# Patient Record
Sex: Male | Born: 1960 | Race: White | Hispanic: No | Marital: Single | Smoking: Current every day smoker
Health system: Southern US, Community
[De-identification: ages and names within clinical notes are randomized; demographics above are authoritative.]

## PROBLEM LIST (undated history)

## (undated) DIAGNOSIS — Z720 Tobacco use: Secondary | ICD-10-CM

## (undated) DIAGNOSIS — I10 Essential (primary) hypertension: Secondary | ICD-10-CM

## (undated) DIAGNOSIS — E7800 Pure hypercholesterolemia, unspecified: Secondary | ICD-10-CM

## (undated) DIAGNOSIS — E119 Type 2 diabetes mellitus without complications: Secondary | ICD-10-CM

## (undated) HISTORY — PX: ABDOMINAL SURGERY: SHX537

## (undated) HISTORY — PX: TONSILLECTOMY: SUR1361

---

## 2003-10-28 ENCOUNTER — Emergency Department (HOSPITAL_COMMUNITY): Admission: EM | Admit: 2003-10-28 | Discharge: 2003-10-28 | Payer: Self-pay | Admitting: Emergency Medicine

## 2015-07-31 ENCOUNTER — Encounter (HOSPITAL_COMMUNITY): Payer: Self-pay | Admitting: *Deleted

## 2015-07-31 ENCOUNTER — Observation Stay (HOSPITAL_COMMUNITY)
Admission: EM | Admit: 2015-07-31 | Discharge: 2015-08-01 | Disposition: A | Discharge status: Home / Self Care | Payer: Non-veteran care | Attending: Internal Medicine | Admitting: Internal Medicine

## 2015-07-31 ENCOUNTER — Emergency Department (HOSPITAL_COMMUNITY): Payer: Non-veteran care

## 2015-07-31 DIAGNOSIS — E119 Type 2 diabetes mellitus without complications: Secondary | ICD-10-CM

## 2015-07-31 DIAGNOSIS — I10 Essential (primary) hypertension: Secondary | ICD-10-CM | POA: Insufficient documentation

## 2015-07-31 DIAGNOSIS — E7800 Pure hypercholesterolemia, unspecified: Secondary | ICD-10-CM | POA: Diagnosis not present

## 2015-07-31 DIAGNOSIS — F17210 Nicotine dependence, cigarettes, uncomplicated: Secondary | ICD-10-CM | POA: Diagnosis not present

## 2015-07-31 DIAGNOSIS — Z7982 Long term (current) use of aspirin: Secondary | ICD-10-CM | POA: Diagnosis not present

## 2015-07-31 DIAGNOSIS — Z8249 Family history of ischemic heart disease and other diseases of the circulatory system: Secondary | ICD-10-CM | POA: Diagnosis not present

## 2015-07-31 DIAGNOSIS — G4733 Obstructive sleep apnea (adult) (pediatric): Secondary | ICD-10-CM | POA: Diagnosis not present

## 2015-07-31 DIAGNOSIS — F1010 Alcohol abuse, uncomplicated: Secondary | ICD-10-CM | POA: Insufficient documentation

## 2015-07-31 DIAGNOSIS — R079 Chest pain, unspecified: Secondary | ICD-10-CM | POA: Diagnosis not present

## 2015-07-31 DIAGNOSIS — F1020 Alcohol dependence, uncomplicated: Secondary | ICD-10-CM

## 2015-07-31 DIAGNOSIS — R072 Precordial pain: Secondary | ICD-10-CM | POA: Diagnosis not present

## 2015-07-31 DIAGNOSIS — E785 Hyperlipidemia, unspecified: Secondary | ICD-10-CM | POA: Diagnosis not present

## 2015-07-31 DIAGNOSIS — F419 Anxiety disorder, unspecified: Secondary | ICD-10-CM | POA: Diagnosis not present

## 2015-07-31 HISTORY — DX: Essential (primary) hypertension: I10

## 2015-07-31 HISTORY — DX: Pure hypercholesterolemia, unspecified: E78.00

## 2015-07-31 HISTORY — DX: Tobacco use: Z72.0

## 2015-07-31 HISTORY — DX: Type 2 diabetes mellitus without complications: E11.9

## 2015-07-31 LAB — CBC
HCT: 52.1 % — ABNORMAL HIGH (ref 39.0–52.0)
Hemoglobin: 17.4 g/dL — ABNORMAL HIGH (ref 13.0–17.0)
MCH: 30.9 pg (ref 26.0–34.0)
MCHC: 33.4 g/dL (ref 30.0–36.0)
MCV: 92.4 fL (ref 78.0–100.0)
Platelets: 194 10*3/uL (ref 150–400)
RBC: 5.64 MIL/uL (ref 4.22–5.81)
RDW: 13.4 % (ref 11.5–15.5)
WBC: 7.8 10*3/uL (ref 4.0–10.5)

## 2015-07-31 LAB — TROPONIN I
Troponin I: <0.03 ng/mL (ref <0.031)
Troponin I: <0.03 ng/mL (ref <0.031)

## 2015-07-31 LAB — BASIC METABOLIC PANEL
Anion gap: 12 (ref 5–15)
BUN: 9 mg/dL (ref 6–20)
CALCIUM: 9.4 mg/dL (ref 8.9–10.3)
CO2: 27 mmol/L (ref 22–32)
CREATININE: 0.99 mg/dL (ref 0.61–1.24)
Chloride: 101 mmol/L (ref 101–111)
GFR calc Af Amer: >60 mL/min (ref >60)
GLUCOSE: 129 mg/dL — AB (ref 65–99)
Potassium: 3.7 mmol/L (ref 3.5–5.1)
Sodium: 140 mmol/L (ref 135–145)

## 2015-07-31 LAB — TSH: TSH: 1.851 u[IU]/mL (ref 0.350–4.500)

## 2015-07-31 LAB — I-STAT TROPONIN, ED: TROPONIN I, POC: 0 ng/mL (ref 0.00–0.08)

## 2015-07-31 LAB — LIPID PANEL
CHOL/HDL RATIO: 4.7 ratio
Cholesterol: 200 mg/dL (ref 0–200)
HDL: 43 mg/dL (ref >40)
LDL Cholesterol: 121 mg/dL — ABNORMAL HIGH (ref 0–99)
Triglycerides: 182 mg/dL — ABNORMAL HIGH (ref <150)
VLDL: 36 mg/dL (ref 0–40)

## 2015-07-31 MED ORDER — MORPHINE SULFATE (PF) 2 MG/ML IV SOLN
2.0000 mg | Freq: Once | INTRAVENOUS | Status: AC
  Administered 2015-07-31: 2 mg via INTRAVENOUS
  Filled 2015-07-31: qty 1

## 2015-07-31 MED ORDER — ASPIRIN 81 MG PO CHEW
81.0000 mg | CHEWABLE_TABLET | Freq: Every day | ORAL | Status: DC
  Administered 2015-08-01: 81 mg via ORAL
  Filled 2015-07-31: qty 1

## 2015-07-31 MED ORDER — LORAZEPAM 1 MG PO TABS
1.0000 mg | ORAL_TABLET | Freq: Four times a day (QID) | ORAL | Status: DC | PRN
  Filled 2015-07-31: qty 1

## 2015-07-31 MED ORDER — GI COCKTAIL ~~LOC~~
30.0000 mL | Freq: Once | ORAL | Status: AC
  Administered 2015-07-31: 30 mL via ORAL
  Filled 2015-07-31: qty 30

## 2015-07-31 MED ORDER — LORAZEPAM 2 MG/ML IJ SOLN: 1.0000 mg | Freq: Four times a day (QID) | INTRAMUSCULAR | Status: DC | PRN

## 2015-07-31 MED ORDER — ADULT MULTIVITAMIN W/MINERALS CH
1.0000 | ORAL_TABLET | Freq: Every day | ORAL | Status: DC
  Filled 2015-07-31: qty 1

## 2015-07-31 MED ORDER — NITROGLYCERIN 0.4 MG SL SUBL
0.4000 mg | SUBLINGUAL_TABLET | SUBLINGUAL | Status: AC | PRN
  Administered 2015-07-31 (×3): 0.4 mg via SUBLINGUAL
  Filled 2015-07-31: qty 1

## 2015-07-31 MED ORDER — PANTOPRAZOLE SODIUM 40 MG PO TBEC
40.0000 mg | DELAYED_RELEASE_TABLET | Freq: Every day | ORAL | Status: DC
  Administered 2015-07-31 – 2015-08-01 (×2): 40 mg via ORAL
  Filled 2015-07-31 (×2): qty 1

## 2015-07-31 MED ORDER — HYDROCHLOROTHIAZIDE 25 MG PO TABS: 25.0000 mg | ORAL_TABLET | Freq: Every day | ORAL | Status: DC

## 2015-07-31 MED ORDER — HYDROCHLOROTHIAZIDE 25 MG PO TABS
25.0000 mg | ORAL_TABLET | Freq: Every day | ORAL | Status: DC
  Administered 2015-07-31 – 2015-08-01 (×2): 25 mg via ORAL
  Filled 2015-07-31 (×2): qty 1

## 2015-07-31 MED ORDER — LISINOPRIL 20 MG PO TABS: 20.0000 mg | ORAL_TABLET | Freq: Every day | ORAL | Status: DC

## 2015-07-31 MED ORDER — VITAMIN B-1 100 MG PO TABS
100.0000 mg | ORAL_TABLET | Freq: Every day | ORAL | Status: DC
  Administered 2015-07-31: 100 mg via ORAL
  Filled 2015-07-31 (×2): qty 1

## 2015-07-31 MED ORDER — FOLIC ACID 1 MG PO TABS
1.0000 mg | ORAL_TABLET | Freq: Every day | ORAL | Status: DC
  Administered 2015-08-01: 1 mg via ORAL
  Filled 2015-07-31: qty 1

## 2015-07-31 MED ORDER — THIAMINE HCL 100 MG/ML IJ SOLN
100.0000 mg | Freq: Every day | INTRAMUSCULAR | Status: DC
  Administered 2015-08-01: 100 mg via INTRAVENOUS
  Filled 2015-07-31: qty 2

## 2015-07-31 MED ORDER — HEPARIN SODIUM (PORCINE) 5000 UNIT/ML IJ SOLN
5000.0000 [IU] | Freq: Three times a day (TID) | INTRAMUSCULAR | Status: DC
  Administered 2015-07-31 – 2015-08-01 (×3): 5000 [IU] via SUBCUTANEOUS
  Filled 2015-07-31 (×3): qty 1

## 2015-07-31 MED ORDER — ADULT MULTIVITAMIN W/MINERALS CH
1.0000 | ORAL_TABLET | Freq: Every day | ORAL | Status: DC
  Administered 2015-08-01: 1 via ORAL
  Filled 2015-07-31: qty 1

## 2015-07-31 NOTE — ED Provider Notes (Addendum)
CSN: [id number]     Arrival date & time 07/31/15  0957 History   First MD Initiated Contact with Patient 07/31/15 1115     Chief Complaint  Patient presents with  . Chest Pain     (Consider location/radiation/quality/duration/timing/severity/associated sxs/prior Treatment) HPI Dominic Francis is a 55 year old man history of diabetes, hypertension, high cholesterol, smoker who presents today with substernal chest pain that began last night around 9:00. He states it lasted approximately 3 hours and then resolved on its own and he was able to sleep. He woke up this morning pain-free but pain began about hour after he awoke. There is again substernal in nature. He denies radiation, nausea, vomiting, dyspnea, or diaphoresis. He states he has had 2 similar episodes in the past where he was evaluated. One was in 2009 and the other was in 2012. He describes having a Cardiolite study and stress tests that he reports were normal. He has not had an evaluation since 2012. He has not been compliant with his medications. He was previously treated at the [facility]. He took his blood pressure medicine last night and this morning. He took 2 regular aspirin prior to coming into the ED. Pain is currently 4 out of 12 which is what it was at the maximum. Past Medical History  Diagnosis Date  . Diabetes mellitus without complication ([place])   . Hypertension   . High cholesterol    History reviewed. No pertinent past surgical history. History reviewed. No pertinent family history. Social History  Substance Use Topics  . Smoking status: Current Every Day Smoker    Types: Cigarettes  . Smokeless tobacco: None  . Alcohol Use: Yes    Review of Systems  All other systems reviewed and are negative.     Allergies  Review of patient's allergies indicates no known allergies.  Home Medications   Prior to Admission medications   Medication Sig Start Date End Date Taking? Authorizing Provider  glipiZIDE (GLUCOTROL  XL) 5 MG 24 hr tablet Take 5 mg by mouth daily with breakfast.   Yes Historical Provider, MD  hydrochlorothiazide (HYDRODIURIL) 25 MG tablet Take 25 mg by mouth daily.   Yes Historical Provider, MD  lisinopril (PRINIVIL,ZESTRIL) 40 MG tablet Take 20 mg by mouth daily.   Yes Historical Provider, MD  Multiple Vitamins-Minerals (MULTIVITAMIN WITH MINERALS) tablet Take 1 tablet by mouth daily.   Yes Historical Provider, MD   BP 173/107 mmHg  Pulse 78  Temp(Src) 97.7 F (36.5 C) (Oral)  Resp 12  SpO2 100% Physical Exam  Constitutional: He is oriented to person, place, and time. He appears well-developed and well-nourished.  HENT:  Head: Normocephalic and atraumatic.  Right Ear: External ear normal.  Left Ear: External ear normal.  Nose: Nose normal.  Mouth/Throat: Oropharynx is clear and moist.  Eyes: Conjunctivae and EOM are normal. Pupils are equal, round, and reactive to light.  Neck: Normal range of motion. Neck supple.  Cardiovascular: Normal rate, regular rhythm, normal heart sounds and intact distal pulses.   Pulmonary/Chest: Effort normal and breath sounds normal. No respiratory distress. He has no wheezes. He exhibits no tenderness.  Abdominal: Soft. Bowel sounds are normal. He exhibits no distension and no mass. There is no tenderness. There is no guarding.  Musculoskeletal: Normal range of motion.  Neurological: He is alert and oriented to person, place, and time. He has normal reflexes. He exhibits normal muscle tone. Coordination normal.  Skin: Skin is warm and dry.  Psychiatric: He has  a normal mood and affect. His behavior is normal. Judgment and thought content normal.  Nursing note and vitals reviewed.   ED Course  Procedures (including critical care time) Labs Review Labs Reviewed  BASIC METABOLIC PANEL - Abnormal; Notable for the following:    Glucose, Bld 129 (*)    All other components within normal limits  CBC - Abnormal; Notable for the following:     Hemoglobin 17.4 (*)    HCT 52.1 (*)    All other components within normal limits  I-STAT TROPOININ, ED    Imaging Review Dg Chest 2 View  07/31/2015  CLINICAL DATA:  8 hour history of central chest pain. EXAM: CHEST  2 VIEW COMPARISON:  10/28/2003 FINDINGS: The cardiac silhouette, mediastinal and hilar contours are within normal limits. There is mild tortuosity and calcification of the thoracic aorta. The lungs are clear. No pleural effusion. Remote healed bilateral rib fractures are noted. No acute bony findings. IMPRESSION: No acute cardiopulmonary findings. Electronically Signed   By: Marijo Sanes M.D.   On: 07/31/2015 10:51   I have personally reviewed and evaluated these images and lab results as part of my medical decision-making.   EKG Interpretation   Date/Time:  Monday July 31 2015 10:03:08 EST Ventricular Rate:  77 PR Interval:  144 QRS Duration: 88 QT Interval:  384 QTC Calculation: 434 R Axis:   93 Text Interpretation:  Normal sinus rhythm Rightward axis Nonspecific T  wave abnormality Abnormal ECG Poor data quality Confirmed by Aubryn Spinola MD,  Andee Poles [phone]) on 07/31/2015 11:16:39 AM      MDM   Final diagnoses:  Chest pain, unspecified chest pain type   Dominic Francis is a 55 year old man with diabetes, hypertension, hypercholesterolemia who is a smoker and presents today complaining of chest pain. EKG is significant for nonspecific T-wave abnormalities. Pain is decreased here after sublingual nitroglycerin, aspirin, GI cocktail, and pain medicine. Cardiology is consulted for further evaluation.     Pattricia Boss, MD 07/31/15 1429  Cardiology saw patient and place note. They request medicine to admit. I spoke with Dr. Hulen Luster on for internal medicine service and they will see and evaluate  Pattricia Boss, MD 07/31/15 1601

## 2015-07-31 NOTE — H&P (Signed)
Date: 07/31/2015               Patient Name:  Dominic Francis MRN: [id number]  DOB: [birth year] Age / Sex: 55 y.o., male   PCP: No primary care provider on file.         Medical Service: Internal Medicine Teaching Service         Attending Physician: Dr. Aldine Contes, MD    First Contact: Dr. Liberty Handy Pager: [phone]  Second Contact: Dr. Julious Oka Pager: [phone]       After Hours (After 5p/  First Contact Pager: [phone]  weekends / holidays): Second Contact Pager: [phone]   Chief Complaint: Chest pain  History of Present Illness:   Ms. Ess is a 55 year old man with a PMH of T2DM, HTN, HLD, OSA, and tobacco abuse who presents with chest pain, which started last night around 9 pm. He describes the pain has at his sternum and non-radiating, saying it felt as though someone were pressing on his chest really hard. He said that it was intermittent through the night, lasting up to 3 hours at a time, and he could not identify and aggravating or alleviating factors. He's reported two similar episodes in the past, 2009 and 2012 and reports being evaluated. Now, he is chest pain free. It was associated with a "little nausea," but he denies any diaphoresis. He endorses a longstanding history of indigestion and gas after meals. He denies any headaches, new one-sided weakness, numbness or tingling, daytime sleepiness, light-headedness, sore throat, shortness of breath, orthopnea, PND, heart palpitations, abdominal pain, nausea, vomiting, diarrhea, leg swelling. With respect to his OSA history, he only endorses daytime irritability. He said he's had a sleep study before, and he has a CPAP, but he does not use it. He said he would not use it in the hospital. He's been prescribed glipizide, lisinopril, HCTZ at home, but he does not take any of them. He says he checks his blood sugars at home, and they've been "fine." Family history significant for uncle having an MI at 96 years old. He reports a 30  pack-year smoking history, drinks a 6-pack of beer a day regularly, and denies any illicit drug use. He does not need an "eye opener," but says he occasionally gets "the shakes" in his hands. He denies any history of seizures. He works the night shift working on [employer]. He lives with someone from whom he's rented a room for 3 years.   In the ED, EKG was significant for t-wave flattening but no ischemic ST changes. His chest pain decreased with sublingual NTG, aspirin, GI cocktail, and IV morphine. i-Stat troponin negative. He was evaluated by cardiology, who recommended trending his troponins overnight, and if negative and stress test in the morning.  Meds: Current Facility-Administered Medications  Medication Dose Route Frequency Provider Last Rate Last Dose  . [START ON 08/01/2015] aspirin chewable tablet 81 mg  81 mg Oral Daily Norman Herrlich, MD      . folic acid (FOLVITE) tablet 1 mg  1 mg Oral Daily Norman Herrlich, MD      . heparin injection 5,000 Units  5,000 Units Subcutaneous 3 times per day Norman Herrlich, MD      . hydrochlorothiazide (HYDRODIURIL) tablet 25 mg  25 mg Oral Daily Norman Herrlich, MD      . LORazepam (ATIVAN) tablet 1 mg  1 mg Oral Q6H PRN Norman Herrlich, MD  Or  . LORazepam (ATIVAN) injection 1 mg  1 mg Intravenous Q6H PRN Norman Herrlich, MD      . multivitamin with minerals tablet 1 tablet  1 tablet Oral Daily Norman Herrlich, MD      . multivitamin with minerals tablet 1 tablet  1 tablet Oral Daily Norman Herrlich, MD      . pantoprazole (PROTONIX) EC tablet 40 mg  40 mg Oral Daily Norman Herrlich, MD      . thiamine (VITAMIN B-1) tablet 100 mg  100 mg Oral Daily Norman Herrlich, MD       Or  . thiamine (B-1) injection 100 mg  100 mg Intravenous Daily Norman Herrlich, MD        Allergies: Allergies as of 07/31/2015  . (No Known Allergies)   Past Medical History  Diagnosis Date  . Diabetes mellitus without complication ([place])   .  Hypertension   . High cholesterol   . Tobacco use    Past Surgical History  Procedure Laterality Date  . Abdominal surgery  approx 1975    part of intestines removed 2nd infection  . Tonsillectomy     Family History  Problem Relation Age of Onset  . Dementia Mother   . Diabetes Father    Social History   Social History  . Marital Status: Single    Spouse Name: N/A  . Number of Children: N/A  . Years of Education: N/A   Occupational History  . Engineer, technical sales, Metallurgist    Social History Main Topics  . Smoking status: Current Every Day Smoker -- 1.00 packs/day for 38 years    Types: Cigarettes  . Smokeless tobacco: Never Used  . Alcohol Use: 25.2 oz/week    42 Standard drinks or equivalent per week     Comment: 6 pack/day  . Drug Use: No  . Sexual Activity: Not on file   Other Topics Concern  . Not on file   Social History Narrative   Pt rents a room from a friend.    Review of Systems: Negative except per HPI  Physical Exam: Blood pressure 140/92, pulse 79, temperature 97.7 F (36.5 C), temperature source Oral, resp. rate 18, SpO2 97 %. General: Lying in bed, NAD. Well developed, well nourished HEENT: No scleral icterus. EOMI. Poor dentition, moist mucous membranes.  Cardiovascular/Chest: RRR, no murmurs or rubs. Normal S1,S2. No S3, S4. Chest pain non reproducible with palpation. Pulmonary: Clear to auscultation bilaterally. No respiratory distress. Abdominal: Soft, NT/ND. Normal bowel sounds Extremities: No clibbing, cyanosis, or edema Skin: No rashes noted. Warm and dry. Neurological: AAOx3. No focal deficits Psychiatric: Behavior and affect normal.  Lab results: Basic Metabolic Panel:  Recent Labs  07/31/15 1012  NA 140  K 3.7  CL 101  CO2 27  GLUCOSE 129*  BUN 9  CREATININE 0.99  CALCIUM 9.4   CBC:  Recent Labs  07/31/15 1012  WBC 7.8  HGB 17.4*  HCT 52.1*  MCV 92.4  PLT 194    Imaging results:  Dg Chest 2  View  07/31/2015  CLINICAL DATA:  8 hour history of central chest pain. EXAM: CHEST  2 VIEW COMPARISON:  10/28/2003 FINDINGS: The cardiac silhouette, mediastinal and hilar contours are within normal limits. There is mild tortuosity and calcification of the thoracic aorta. The lungs are clear. No pleural effusion. Remote healed bilateral rib fractures are noted. No acute bony findings. IMPRESSION: No acute cardiopulmonary findings. Electronically  Signed   By: Marijo Sanes M.D.   On: 07/31/2015 10:51    EKG: T-Wave Flattening  Assessment & Plan by Problem: Principal Problem:   Precordial chest pain Active Problems:   Diabetes mellitus without complication (H[facility])   Hypertension   High cholesterol   Chest pain  Chest Pain: Appears non-cardiac in origin and could be GERD or precordial catch syndrome. We will trend troponins, and if negative, he will have a stress test in the morning.  - ASA 81 mg daily - NTG 0.4 mg q5 minutes prn - Protonix 40 mg daily - Trend troponins - Stress test in AM  Tobacco Abuse:  Patient says he would not like a patch at the moment. Likely leading to Hgb of 17.4. - Cessation counseling provided - Nicotine patch 14 mg if requested - CBC in AM  Alcohol Abuse: Patient does report some withdrawal symptoms in the past, and is a regular drinker - CIWA protocol - Thiamine, folate po  HTN: 140/92 on admission. Previously prescribed HCTZ and lisinopril - Restart only HCTZ 25 mg daily for now  [id number] without complication: BG [id number] on admission. - Holding glipizide  DVT prophylaxis: Heparin [place]   Dispo: Disposition is deferred at this time, awaiting improvement of current medical problems. Anticipated discharge in approximately 1-2 day(s).   The patient does not have a current PCP (No primary care provider on file.) and does need an [facility] hospital follow-up appointment after discharge.  The patient does not have transportation limitations that hinder transportation to  clinic appointments.  Signed: Liberty Handy, MD 07/31/2015, 5:50 PM

## 2015-07-31 NOTE — Consult Note (Signed)
CARDIOLOGY CONSULT NOTE   Patient ID: Dominic Francis MRN: [id number] DOB/AGE: Dec 21, 1960 55 y.o.  Admit date: 07/31/2015  Primary Physician   Was the [place], none in 2-3 years Primary Cardiologist   None Reason for Consultation  Chest pain  Dominic Francis is a 55 y.o. year old male with a history of HTN, HL, DM, tob use, ETOH use.   He has no recent medical care, generally follows at the [facility] in [place], but has not been there since they opened the new facility (?2014).  Pt with hx chest pain, evaluation always negative. He had a stress test at [facility] in 2009 and it was OK.   Pt has been having chest pain regularly. It would generally be at work (3 pm-1:30 am) He would drink water and the pain would improve. The symptoms have been coming on more frequently recently.  He had symptoms last pm at home, and they were more severe than usual. They kept him from sleeping. He took a BP med (probably HCTZ) with no change. No other meds tried. He finally fell asleep, and was pain-free when he woke up. There were no associated symptoms with the pain. Pressure and tightness, 4/10.   This am, initially had no pain. About 1 hour after drinking coffee, he had recurrent pain. He took his prescribed medications plus 2 regular aspirin. It started about 7:30 am. He came to the hospital when symptoms did not resolve. A GI cocktail helped the pain and morphine also helped. Pt got SL NTG x 3, but did not mention them as helping. Still with 1/10 pain. It may have been a 0/10 just after morphine, but is now present at a low level.     Past Medical History  Diagnosis Date  . Diabetes mellitus without complication ([place])   . Hypertension   . High cholesterol   . Tobacco use      Past Surgical History  Procedure Laterality Date  . Abdominal surgery  approx 1975    part of intestines removed 2nd infection  . Tonsillectomy      No Known Allergies  I have reviewed the patient's current medications Prior  to Admission medications - NONE  Medication Sig Start Date End Date Taking? Authorizing Provider  glipiZIDE (GLUCOTROL XL) 5 MG 24 hr tablet Take 5 mg by mouth daily with breakfast.   No Historical Provider, MD  hydrochlorothiazide (HYDRODIURIL) 25 MG tablet Take 25 mg by mouth daily.   No Historical Provider, MD  lisinopril (PRINIVIL,ZESTRIL) 40 MG tablet Take 20 mg by mouth daily.   No Historical Provider, MD  Multiple Vitamins-Minerals (MULTIVITAMIN WITH MINERALS) tablet Take 1 tablet by mouth daily.   Yes Historical Provider, MD     Social History   Social History  . Marital Status: Single    Spouse Name: N/A  . Number of Children: N/A  . Years of Education: N/A   Occupational History  . Engineer, technical sales, Metallurgist    Social History Main Topics  . Smoking status: Current Every Day Smoker -- 1.00 packs/day for 38 years    Types: Cigarettes  . Smokeless tobacco: Never Used  . Alcohol Use: 25.2 oz/week    42 Standard drinks or equivalent per week     Comment: 6 pack/day  . Drug Use: No  . Sexual Activity: Not on file   Other Topics Concern  . Not on file   Social History Narrative   Pt rents a room  from a friend.    Family Status  Relation Status Death Age  . Mother Deceased 73    Hx SEM  . Father Deceased 39    MVA   Family History  Problem Relation Age of Onset  . Dementia Mother   . Diabetes Father      ROS:  Full 14 point review of systems complete and found to be negative unless listed above.  Physical Exam: Blood pressure 143/92, pulse 67, temperature 97.7 F (36.5 C), temperature source Oral, resp. rate 14, SpO2 97 %.  General: Well developed, well nourished, male in no acute distress Head: Eyes PERRLA, No xanthomas.   Normocephalic and atraumatic, oropharynx without edema or exudate. Dentition: poor Lungs: Rales bases  Heart: HRRR S1 S2, no rub/gallop, no murmur. pulses are 2+ extrem.   Neck: No carotid bruits. No lymphadenopathy.  JVD not  elevated. Abdomen: Bowel sounds present, abdomen soft and non-tender without masses or hernias noted. Msk:  No spine or cva tenderness. No weakness, no joint deformities or effusions. Extremities: No clubbing or cyanosis. No edema.  Neuro: Alert and oriented X 3. No focal deficits noted. Psych:  Good affect, responds appropriately Skin: No rashes or lesions noted.  Labs:   Lab Results  Component Value Date   WBC 7.8 07/31/2015   HGB 17.4* 07/31/2015   HCT 52.1* 07/31/2015   MCV 92.4 07/31/2015   PLT 194 07/31/2015     Recent Labs Lab 07/31/15 1012  NA 140  K 3.7  CL 101  CO2 27  BUN 9  CREATININE 0.99  CALCIUM 9.4  GLUCOSE 129*    Recent Labs  07/31/15 1026  TROPIPOC 0.00   ECG: 07/31/2015 SR, diffuse T wave flattening Vent. rate 77 BPM PR interval 144 ms QRS duration 88 ms QT/QTc 384/434 ms P-R-T axes 17 93 10  Radiology:  Dg Chest 2 View 07/31/2015  CLINICAL DATA:  8 hour history of central chest pain. EXAM: CHEST  2 VIEW COMPARISON:  10/28/2003 FINDINGS: The cardiac silhouette, mediastinal and hilar contours are within normal limits. There is mild tortuosity and calcification of the thoracic aorta. The lungs are clear. No pleural effusion. Remote healed bilateral rib fractures are noted. No acute bony findings. IMPRESSION: No acute cardiopulmonary findings. Electronically Signed   By: Marijo Sanes M.D.   On: 07/31/2015 10:51    ASSESSMENT AND PLAN:   The patient was seen today by Dr Mare Ferrari, the patient evaluated and the data reviewed.  Principal Problem:   Precordial chest pain - symptoms are atypical, partly relieved by GI cocktail, initial ez and ECG negative - however, he has multiple CRFs that are poorly controlled. - agree with admit to R/O MI - if ez negative (likely), would do inpatient stress test in am  Otherwise, feel he would benefit from IM admit to get better management of his chronic medical conditions. May need CIWA protocol Active  Problems:   Diabetes mellitus without complication ([place])   Hypertension   High cholesterol   Tobacco use    ETOH use   Signed: Lenoard Aden 07/31/2015 3:08 PM Beeper [id number]  Co-Sign MD The patient was seen in the emergency room.  Currently he is essentially pain-free.  He is presenting chest pain appeared to respond to GI cocktail and morphine.  He had also taken aspirin prior to coming to the emergency room.  As noted above, he has multiple risk factors for coronary disease and has not been on any medication to  address these recently.  He has been working full time on the second shift Engineer, building services on aircraft.  He works four 10 hour days from 3 PM until 1 AM and he has 4 days off each week.  He smokes about one pack of cigarettes daily.  He does admit to a smoker's cough.  Physical examination today reveals clear lung fields.  The heart reveals no murmur gallop rub or click.  The abdomen is nontender.  The liver is not enlarged.  The extremities show good pedal pulses and no phlebitis or edema.  His electrocardiogram was personally reviewed and shows nonspecific T-wave flattening but no ischemic ST segment depression.  Chest x-ray is unremarkable except for old healed rib fractures. I agree with plan as noted above.  If cardiac enzymes remain negative overnight, anticipate stress test in a.m. and possible discharge later in the day EF stress test normal.  He will require good teaching about treatment of his various chronic conditions to reduce risk factors going forward.  Smoking cessation discussed.

## 2015-07-31 NOTE — ED Notes (Signed)
Pt reports mid chest pressure that started last night. Recent cough. Denies sob. Airway intact and ekg done at triage.

## 2015-07-31 NOTE — ED Notes (Signed)
Pt refuses IV at this time 

## 2015-08-01 ENCOUNTER — Other Ambulatory Visit (HOSPITAL_COMMUNITY): Payer: Non-veteran care

## 2015-08-01 ENCOUNTER — Observation Stay (HOSPITAL_COMMUNITY): Payer: Non-veteran care

## 2015-08-01 DIAGNOSIS — E785 Hyperlipidemia, unspecified: Secondary | ICD-10-CM

## 2015-08-01 DIAGNOSIS — F17210 Nicotine dependence, cigarettes, uncomplicated: Secondary | ICD-10-CM | POA: Diagnosis not present

## 2015-08-01 DIAGNOSIS — R072 Precordial pain: Secondary | ICD-10-CM | POA: Diagnosis not present

## 2015-08-01 DIAGNOSIS — I10 Essential (primary) hypertension: Secondary | ICD-10-CM | POA: Insufficient documentation

## 2015-08-01 DIAGNOSIS — R079 Chest pain, unspecified: Secondary | ICD-10-CM | POA: Diagnosis not present

## 2015-08-01 DIAGNOSIS — F1020 Alcohol dependence, uncomplicated: Secondary | ICD-10-CM | POA: Diagnosis not present

## 2015-08-01 LAB — CBC
HCT: 49.1 % (ref 39.0–52.0)
Hemoglobin: 16.7 g/dL (ref 13.0–17.0)
MCH: 31.3 pg (ref 26.0–34.0)
MCHC: 34 g/dL (ref 30.0–36.0)
MCV: 92.1 fL (ref 78.0–100.0)
PLATELETS: 174 10*3/uL (ref 150–400)
RBC: 5.33 MIL/uL (ref 4.22–5.81)
RDW: 13.5 % (ref 11.5–15.5)
WBC: 7.9 10*3/uL (ref 4.0–10.5)

## 2015-08-01 LAB — NM MYOCAR MULTI W/SPECT W/WALL MOTION / EF
CHL CUP RESTING HR STRESS: 70 {beats}/min
CSEPED: 5 min
CSEPEW: 1 METS
MPHR: 165 {beats}/min
Peak HR: 110 {beats}/min
Percent HR: 66 %

## 2015-08-01 LAB — HEMOGLOBIN A1C
HEMOGLOBIN A1C: 6.4 % — AB (ref 4.8–5.6)
Mean Plasma Glucose: 137 mg/dL

## 2015-08-01 LAB — TROPONIN I

## 2015-08-01 MED ORDER — REGADENOSON 0.4 MG/5ML IV SOLN
INTRAVENOUS | Status: AC
  Filled 2015-08-01: qty 5

## 2015-08-01 MED ORDER — ATORVASTATIN CALCIUM 40 MG PO TABS: 40.0000 mg | ORAL_TABLET | Freq: Every day | ORAL | Status: DC

## 2015-08-01 MED ORDER — REGADENOSON 0.4 MG/5ML IV SOLN
0.4000 mg | Freq: Once | INTRAVENOUS | Status: AC
  Administered 2015-08-01: 0.4 mg via INTRAVENOUS
  Filled 2015-08-01: qty 5

## 2015-08-01 MED ORDER — NICOTINE 21 MG/24HR TD PT24
21.0000 mg | MEDICATED_PATCH | Freq: Every day | TRANSDERMAL | Status: DC
  Administered 2015-08-01: 21 mg via TRANSDERMAL
  Filled 2015-08-01: qty 1

## 2015-08-01 MED ORDER — ASPIRIN 81 MG PO CHEW: 81.0000 mg | CHEWABLE_TABLET | Freq: Every day | ORAL | Status: DC

## 2015-08-01 MED ORDER — TECHNETIUM TC 99M SESTAMIBI GENERIC - CARDIOLITE
10.0000 | Freq: Once | INTRAVENOUS | Status: AC | PRN
  Administered 2015-08-01: 10 via INTRAVENOUS

## 2015-08-01 MED ORDER — TECHNETIUM TC 99M SESTAMIBI GENERIC - CARDIOLITE
30.0000 | Freq: Once | INTRAVENOUS | Status: AC | PRN
  Administered 2015-08-01: 30 via INTRAVENOUS

## 2015-08-01 MED ORDER — NICOTINE 21 MG/24HR TD PT24: 21.0000 mg | MEDICATED_PATCH | Freq: Every day | TRANSDERMAL | Status: DC

## 2015-08-01 MED ORDER — PANTOPRAZOLE SODIUM 40 MG PO TBEC: 40.0000 mg | DELAYED_RELEASE_TABLET | Freq: Every day | ORAL | Status: DC

## 2015-08-01 NOTE — Progress Notes (Signed)
Patient Name: Dominic Francis Date of Encounter: 08/01/2015  Principal Problem:   Precordial chest pain Active Problems:   Diabetes mellitus without complication ([place])   Hypertension   High cholesterol   Chest pain    Primary Cardiologist: New Patient Profile: 55 yo male w/ PMH of HTN, HLD, Type 2 DM, tobacco abuse, and ETOH abuse, admitted on 07/31/2015 for chest pain.  SUBJECTIVE: Denies any repeat episodes of chest pain overnight. Denies palpitations or shortness of breath. Wants to go home as soon as possible today. Was not listed as "NPO since midnight" but reports only having sips of water since midnight. In agreement to stay for NST this morning.  OBJECTIVE Filed Vitals:   07/31/15 1848 07/31/15 2358 08/01/15 0001 08/01/15 0651  BP:  135/88 135/88 115/83  Pulse:  60 64 69  Temp:  97.8 F (36.6 C)  97.6 F (36.4 C)  TempSrc:  Oral  Oral  Resp:  18  18  Height: 5\' 5"  (1.651 m)     Weight:      SpO2:  99%  99%    Intake/Output Summary (Last 24 hours) at 08/01/15 0716 Last data filed at 07/31/15 1753  Gross per 24 hour  Intake      0 ml  Output    300 ml  Net   -300 ml   Filed Weights   07/31/15 1734  Weight: 187 lb 8 oz (85.049 kg)    PHYSICAL EXAM General: Well developed, well nourished, male in no acute distress. Head: Normocephalic, atraumatic.  Neck: Supple without bruits, JVD not elevated. Lungs:  Resp regular and unlabored, CTA without wheezing or rales. Heart: RRR, S1, S2, no S3, S4, or murmur; no rub. Abdomen: Soft, non-tender, non-distended with normoactive bowel sounds. No hepatomegaly. No rebound/guarding. No obvious abdominal masses. Extremities: No clubbing, cyanosis, or edema. Distal pedal pulses are 2+ bilaterally. Neuro: Alert and oriented X 3. Moves all extremities spontaneously. Psych: Normal affect.  LABS: CBC: Recent Labs  07/31/15 1012 08/01/15 0429  WBC 7.8 7.9  HGB 17.4* 16.7  HCT 52.1* 49.1  MCV 92.4 92.1  PLT 194 [id number]    Basic Metabolic Panel: Recent Labs  07/31/15 1012  NA 140  K 3.7  CL 101  CO2 27  GLUCOSE 129*  BUN 9  CREATININE 0.99  CALCIUM 9.4   Cardiac Enzymes: Recent Labs  07/31/15 1645 07/31/15 2226 08/01/15 0429  TROPONINI <0.03 <0.03 <0.03    Recent Labs  07/31/15 1026  TROPIPOC 0.00   Hemoglobin A1C: Recent Labs  07/31/15 1645  HGBA1C 6.4*   Fasting Lipid Panel: Recent Labs  07/31/15 1645  CHOL 200  HDL 43  LDLCALC 121*  TRIG 182*  CHOLHDL 4.7   Thyroid Function Tests: Recent Labs  07/31/15 1645  TSH 1.851    TELE: NSR with rate in 60's - 70's. No atopic events.       Radiology/Studies: Dg Chest 2 View: 07/31/2015  CLINICAL DATA:  8 hour history of central chest pain. EXAM: CHEST  2 VIEW COMPARISON:  10/28/2003 FINDINGS: The cardiac silhouette, mediastinal and hilar contours are within normal limits. There is mild tortuosity and calcification of the thoracic aorta. The lungs are clear. No pleural effusion. Remote healed bilateral rib fractures are noted. No acute bony findings. IMPRESSION: No acute cardiopulmonary findings. Electronically Signed   By: Marijo Sanes M.D.   On: 07/31/2015 10:51     Current Medications:  . aspirin  81 mg Oral Daily  .  folic acid  1 mg Oral Daily  . heparin  5,000 Units Subcutaneous 3 times per day  . hydrochlorothiazide  25 mg Oral Daily  . multivitamin with minerals  1 tablet Oral Daily  . pantoprazole  40 mg Oral Daily  . thiamine  100 mg Oral Daily   Or  . thiamine  100 mg Intravenous Daily      ASSESSMENT AND PLAN:  1. Precordial chest pain - denies any repeat episodes of chest pain overnight.  - cyclic troponin values have been negative. - for Nuclear Stress Test this AM. If normal, will likely be stable for discharge from Cardiology perspective.  2. HTN - BP improved since admission - continue current medication regimen. Can likely restart PTA Lisinopril at 10mg  daily.  3. Diabetes mellitus without  complication - 123456 at 6.4. - per admitting team  4. Alcohol Use - currently on CIWA protocol.   Signed, Erma Heritage , PA-C 7:16 AM 08/01/2015 Pager: [phone] Patient known to me from admission consult yesterday. Enzymes normal overnight. Presently down in nuclear getting stress test. If stress test normal he can be discharged this afternoon. Needs to re-establish close followup with doctors at the [facility] hospital to manage risk factors.

## 2015-08-01 NOTE — Progress Notes (Signed)
Order received to discharge.  Telemetry removed and CCMD notified.  IV removed with catheter intact.  Discharge education given including new medications and smoking cessation education.  Pt indicates understanding.  Nicotine patch removed at discharge for Pt safety as Pt intends to smoke upon discharge.  Pt ambulated with this nurse to exit.  Pt denies chest pain or sob.  No s/s of distress.  Pt stable at discharge.

## 2015-08-01 NOTE — Discharge Summary (Signed)
Name: Dominic Francis MRN: [id number] DOB: 09/21/1960 55 y.o. PCP: No primary care provider on file.  Date of Admission: 07/31/2015 10:49 AM Date of Discharge: 08/01/2015 Attending Physician: No att. providers found  Discharge Diagnosis: 1. Chest pain 2. HTN 3. HLD 4. T2DM 5. Alcohol Use Disorder 6. Tobacco Use Disorder   Discharge Medications:   Medication List    STOP taking these medications        glipiZIDE 5 MG 24 hr tablet  Commonly known as:  GLUCOTROL XL      TAKE these medications        aspirin 81 MG chewable tablet  Chew 1 tablet (81 mg total) by mouth daily.     atorvastatin 40 MG tablet  Commonly known as:  LIPITOR  Take 1 tablet (40 mg total) by mouth daily.     hydrochlorothiazide 25 MG tablet  Commonly known as:  HYDRODIURIL  Take 25 mg by mouth daily.     lisinopril 40 MG tablet  Commonly known as:  PRINIVIL,ZESTRIL  Take 20 mg by mouth daily.     multivitamin with minerals tablet  Take 1 tablet by mouth daily.     nicotine 21 mg/24hr patch  Commonly known as:  NICODERM CQ - dosed in mg/24 hours  Place 1 patch (21 mg total) onto the skin daily.     pantoprazole 40 MG tablet  Commonly known as:  PROTONIX  Take 1 tablet (40 mg total) by mouth daily.        Disposition and follow-up:   Dominic Francis was discharged from [facility] in good condition.  At the hospital follow up visit please address:  1.  Assess compliance to medications at follow-up visit. Assess need for glipizide.   2.  Labs / imaging needed at time of follow-up: Outpatient TTE after he has been on blood pressure medications for several months.  3.  Pending labs/ test needing follow-up: None  Follow-up Appointments: Follow-up Information    Follow up with Ermalinda Barrios, PA-C On 08/21/2015.   Specialty:  Cardiology   Why:  Cardiology Hospital Follow-Up on 08/21/2015 at 12:45PM.   Contact information:   [address] [place]  [address] [phone]       Follow up with Sherin Quarry, MD On 08/10/2015.   Specialty:  Internal Medicine   Why:  at 10:15   Contact information:   [address] [place] [address] [phone]       Discharge Instructions: Discharge Instructions    Call MD for:  difficulty breathing, headache or visual disturbances    Complete by:  As directed      Call MD for:  persistant nausea and vomiting    Complete by:  As directed      Call MD for:  severe uncontrolled pain    Complete by:  As directed      Increase activity slowly    Complete by:  As directed           Start taking lipitor 40mg  daily for your high cholesterol.   I have also sent in prescriptions for nicotine patches to help you quit smoking.   Start taking protonix daily for acid reflux.   Our clinic office will call you a hospital follow up appointment in 1-2 weeks. If you do not hear from them please call [phone] for an appointment. Also, meanwhile please try and schedule an appointment with the [place].  Consultations: Treatment Team:  Rounding Lbcardiology, MD  Procedures Performed:  Dg Chest 2 View  07/31/2015  CLINICAL DATA:  8 hour history of central chest pain. EXAM: CHEST  2 VIEW COMPARISON:  10/28/2003 FINDINGS: The cardiac silhouette, mediastinal and hilar contours are within normal limits. There is mild tortuosity and calcification of the thoracic aorta. The lungs are clear. No pleural effusion. Remote healed bilateral rib fractures are noted. No acute bony findings. IMPRESSION: No acute cardiopulmonary findings. Electronically Signed   By: Marijo Sanes M.D.   On: 07/31/2015 10:51   Nm Myocar Multi W/spect W/wall Motion / Ef  08/01/2015  CLINICAL DATA:  Diabetes.  Hypertension.  Tobacco and alcohol abuse. EXAM: MYOCARDIAL IMAGING WITH SPECT (REST AND PHARMACOLOGIC-STRESS) GATED LEFT VENTRICULAR WALL MOTION STUDY LEFT VENTRICULAR EJECTION FRACTION TECHNIQUE: Standard myocardial SPECT imaging was  performed after resting intravenous injection of 10 mCi Tc-21m sestamibi. Subsequently, intravenous infusion of Lexiscan was performed under the supervision of the Cardiology staff. At peak effect of the drug, 30 mCi Tc-65m sestamibi was injected intravenously and standard myocardial SPECT imaging was performed. Quantitative gated imaging was also performed to evaluate left ventricular wall motion, and estimate left ventricular ejection fraction. COMPARISON:  Chest radiograph of 07/31/2015 FINDINGS: Perfusion: decreased rest uptake in the inferior wall, possibly related to diaphragmatic attenuation. Smaller area of mild reversibility is suspected within the anterior septal wall, apical segment. Wall Motion: No focal wall motion abnormality. Left Ventricular Ejection Fraction: 48 % End diastolic volume 80 ml End systolic volume 42 ml IMPRESSION: 1. Subtle reversibility suspected in the apical segment of the anteroseptal wall. 2. No focal wall motion abnormality. 3. Left ventricular ejection fraction 48% 4. Intermediate-risk stress test findings*. *2012 Appropriate Use Criteria for Coronary Revascularization Focused Update: J Am Coll Cardiol. [id number]. http://content.airportbarriers.com.aspx?articleid=[email or web address] Electronically Signed   By: Abigail Miyamoto M.D.   On: 08/01/2015 15:16   Admission HPI: Dominic Francis is a 55 year old man with a PMH of T2DM, HTN, HLD, OSA, and tobacco abuse who presents with chest pain, which started last night around 9 pm. He describes the pain has at his sternum and non-radiating, saying it felt as though someone were pressing on his chest really hard. He said that it was intermittent through the night, lasting up to 3 hours at a time, and he could not identify and aggravating or alleviating factors. He's reported two similar episodes in the past, 2009 and 2012 and reports being evaluated. Now, he is chest pain free. It was associated with a "little nausea," but he denies any  diaphoresis. He endorses a longstanding history of indigestion and gas after meals. He denies any headaches, new one-sided weakness, numbness or tingling, daytime sleepiness, light-headedness, sore throat, shortness of breath, orthopnea, PND, heart palpitations, abdominal pain, nausea, vomiting, diarrhea, leg swelling. With respect to his OSA history, he only endorses daytime irritability. He said he's had a sleep study before, and he has a CPAP, but he does not use it. He said he would not use it in the hospital. He's been prescribed glipizide, lisinopril, HCTZ at home, but he does not take any of them. He says he checks his blood sugars at home, and they've been "fine." Family history significant for uncle having an MI at 26 years old. He reports a 30 pack-year smoking history, drinks a 6-pack of beer a day regularly, and denies any illicit drug use. He does not need an "eye opener," but says he occasionally gets "the shakes" in his hands.  He denies any history of seizures. He works the night shift working on [employer]. He lives with someone from whom he's rented a room for 3 years.   In the ED, EKG was significant for t-wave flattening but no ischemic ST changes. His chest pain decreased with sublingual NTG, aspirin, GI cocktail, and IV morphine. i-Stat troponin negative. He was evaluated by cardiology, who recommended trending his troponins overnight, and if negative and stress test in the morning.  Hospital Course by problem list:   Chest pain: From admission, his chest pain appeared to be non-cardiac in origin, and his troponins were negative x3. A nuclear stress test was performed on day of discharge, which was reviewed by cardiologist Dr. Mare Ferrari, and there was no significant ischemia demonstrated, only inferior attenuation and apical thinning (SDS 2). He was set up with an outpatient cardiology visit on 1/30. He remained chest pain free for 24 hours.   HTN: Previously was  prescribed HCTZ and lisinopril, but he had not been taking them. .140/92 on admission, which improved to 135/84 on day of discharge with HCTZ 25 mg daily.  HLD: LDL 121. He was started on atorvastatin 40 mg daily  T2DM: He was prescribed glipizide, which he was not taken. This was held and his BGs were in the 100s. Hgb A1c was 6.4. Glipizide was held on discharge.   Alcohol Use Disorder: Patient was agitated on morning discharge which responded well to nicotine patch and ativan. He denied any withdrawal symptoms  Tobacco Use Disorder: Smoking cessation discussed. On 21 mg patch in the hospital.   Discharge Vitals:   BP 136/86 mmHg  Pulse 79  Temp(Src) 97.7 F (36.5 C) (Oral)  Resp 18  Ht 5\' 5"  (1.651 m)  Wt 187 lb 8 oz (85.049 kg)  BMI 31.20 kg/m2  SpO2 97%  Discharge Labs:  Results for orders placed or performed during the hospital encounter of 07/31/15 (from the past 24 hour(s))  Troponin I     Status: None   Collection Time: 07/31/15 10:26 PM  Result Value Ref Range   Troponin I <0.03 <0.031 ng/mL  Troponin I     Status: None   Collection Time: 08/01/15  4:29 AM  Result Value Ref Range   Troponin I <0.03 <0.031 ng/mL  CBC     Status: None   Collection Time: 08/01/15  4:29 AM  Result Value Ref Range   WBC 7.9 4.0 - 10.5 K/uL   RBC 5.33 4.22 - 5.81 MIL/uL   Hemoglobin 16.7 13.0 - 17.0 g/dL   HCT 49.1 39.0 - 52.0 %   MCV 92.1 78.0 - 100.0 fL   MCH 31.3 26.0 - 34.0 pg   MCHC 34.0 30.0 - 36.0 g/dL   RDW 13.5 11.5 - 15.5 %   Platelets 174 150 - 400 K/uL    Signed: Liberty Handy, MD 08/01/2015, 5:14 PM

## 2015-08-01 NOTE — Progress Notes (Signed)
Subjective:  Dominic Francis had no acute complaints this morning. No chest pain or shortness of breath. He had an episode of anxiety that was treated with Ativan.  Objective: Vital signs in last 24 hours: Filed Vitals:   08/01/15 0855 08/01/15 0920 08/01/15 0921 08/01/15 0923  BP: 135/84 139/101 136/92 136/89  Pulse:      Temp:      TempSrc:      Resp:      Height:      Weight:      SpO2:       Weight change:   Intake/Output Summary (Last 24 hours) at 08/01/15 1159 Last data filed at 08/01/15 0900  Gross per 24 hour  Intake     25 ml  Output    300 ml  Net   -275 ml   Physical Exam: General: Lying in bed, NAD. Well developed, well nourished HEENT: No scleral icterus. Poor dentition, moist mucous membranes.  Cardiovascular/Chest: RRR, no murmurs or rubs. Normal S1,S2. No S3, S4.  Pulmonary: Clear to auscultation bilaterally. No respiratory distress. Abdominal: Soft, NT/ND. Normal bowel sounds Extremities: No clubbing, cyanosis, or edema Skin: No rashes noted. Warm and dry. Psychiatric: Behavior and affect normal.  Lab Results: Basic Metabolic Panel:  Recent Labs Lab 07/31/15 1012  NA 140  K 3.7  CL 101  CO2 27  GLUCOSE 129*  BUN 9  CREATININE 0.99  CALCIUM 9.4   CBC:  Recent Labs Lab 07/31/15 1012 08/01/15 0429  WBC 7.8 7.9  HGB 17.4* 16.7  HCT 52.1* 49.1  MCV 92.4 92.1  PLT 194 174   Cardiac Enzymes:  Recent Labs Lab 07/31/15 1645 07/31/15 2226 08/01/15 0429  TROPONINI <0.03 <0.03 <0.03   Hemoglobin A1C:  Recent Labs Lab 07/31/15 1645  HGBA1C 6.4*   Fasting Lipid Panel:  Recent Labs Lab 07/31/15 1645  CHOL 200  HDL 43  LDLCALC 121*  TRIG 182*  CHOLHDL 4.7   Thyroid Function Tests:  Recent Labs Lab 07/31/15 1645  TSH 1.851    Micro Results: No results found for this or any previous visit (from the past 240 hour(s)). Studies/Results: Dg Chest 2 View  07/31/2015  CLINICAL DATA:  8 hour history of central chest pain.  EXAM: CHEST  2 VIEW COMPARISON:  10/28/2003 FINDINGS: The cardiac silhouette, mediastinal and hilar contours are within normal limits. There is mild tortuosity and calcification of the thoracic aorta. The lungs are clear. No pleural effusion. Remote healed bilateral rib fractures are noted. No acute bony findings. IMPRESSION: No acute cardiopulmonary findings. Electronically Signed   By: Marijo Sanes M.D.   On: 07/31/2015 10:51   Medications: I have reviewed the patient's current medications. Scheduled Meds: . aspirin  81 mg Oral Daily  . folic acid  1 mg Oral Daily  . heparin  5,000 Units Subcutaneous 3 times per day  . hydrochlorothiazide  25 mg Oral Daily  . multivitamin with minerals  1 tablet Oral Daily  . nicotine  21 mg Transdermal Daily  . pantoprazole  40 mg Oral Daily  . regadenoson      . thiamine  100 mg Oral Daily   Or  . thiamine  100 mg Intravenous Daily   Continuous Infusions:  PRN Meds:.LORazepam **OR** LORazepam Assessment/Plan:  Chest Pain: Appears non-cardiac in origin and could be GERD or precordial catch syndrome. Troponins negative x3. Stress test interpretation pending.  - ASA 81 mg daily - NTG 0.4 mg q5 minutes prn - Protonix  40 mg daily  Tobacco Abuse: Patient says he would not like a patch at the moment. Likely leading to Hgb of 17.4 admission, 16.7 now. - Cessation counseling provided - Nicotine patch 21 mg  Alcohol Abuse: Patient does report some withdrawal symptoms in the past, and is a regular drinker. Did have some anxiety this AM, needing ativan. - CIWA protocol with ativan - Thiamine, folate po  HTN: 140/92 on admission. Previously prescribed HCTZ and lisinopril. Improved to 135/84 today. - Only HCTZ 25 mg daily for now  [id number] without complication: BG [id number] on admission. A1c 6.4 - Holding glipizide  DVT prophylaxis: Heparin [place]  Dispo: Likely discharge home today if stress test negative.  The patient does not have a current PCP (No primary  care provider on file.) and does need an [facility] hospital follow-up appointment after discharge.  The patient does not have transportation limitations that hinder transportation to clinic appointments.  .Services Needed at time of discharge: Y = Yes, Blank = No PT:   OT:   RN:   Equipment:   Other:     LOS: 1 day   Liberty Handy, MD 08/01/2015, 11:59 AM

## 2015-08-01 NOTE — Progress Notes (Signed)
I reviewed his myoview images myself. There is no significant ischemia demonstrated. There is inferior attenuation and apical thinning. SDS is only 2. Okay for discharge from the cardiac standpoint. It would be good to check an outpatient echo after he has been on his BP meds for several months. Compliance has been a problem in the past.

## 2015-08-01 NOTE — Discharge Instructions (Signed)
Start taking lipitor 40mg  daily for your high cholesterol.   I have also sent in prescriptions for nicotine patches to help you quit smoking.   Start taking protonix daily for acid reflux.   Our clinic office will call you a hospital follow up appointment in 1-2 weeks. If you do not hear from them please call [phone] for an appointment. Also, meanwhile please try and schedule an appointment with the [place].

## 2015-08-10 ENCOUNTER — Encounter: Payer: Self-pay | Admitting: Internal Medicine

## 2015-08-10 ENCOUNTER — Ambulatory Visit (INDEPENDENT_AMBULATORY_CARE_PROVIDER_SITE_OTHER): Payer: Non-veteran care | Admitting: Internal Medicine

## 2015-08-10 VITALS — BP 129/79 | HR 72 | Temp 97.9°F | Ht 65.0 in | Wt 191.3 lb

## 2015-08-10 DIAGNOSIS — F17210 Nicotine dependence, cigarettes, uncomplicated: Secondary | ICD-10-CM | POA: Diagnosis not present

## 2015-08-10 DIAGNOSIS — F17200 Nicotine dependence, unspecified, uncomplicated: Secondary | ICD-10-CM

## 2015-08-10 DIAGNOSIS — I10 Essential (primary) hypertension: Secondary | ICD-10-CM | POA: Diagnosis not present

## 2015-08-10 DIAGNOSIS — E119 Type 2 diabetes mellitus without complications: Secondary | ICD-10-CM | POA: Diagnosis not present

## 2015-08-10 DIAGNOSIS — K219 Gastro-esophageal reflux disease without esophagitis: Secondary | ICD-10-CM | POA: Diagnosis not present

## 2015-08-10 LAB — GLUCOSE, CAPILLARY: GLUCOSE-CAPILLARY: 136 mg/dL — AB (ref 65–99)

## 2015-08-10 NOTE — Progress Notes (Signed)
Patient ID: Dominic Francis, male   DOB: [birth year], 55 y.o.   MRN: [id number]    Subjective:   Patient ID: Dominic Francis male   DOB: [birth year] 55 y.o.   MRN: [id number]  HPI: [name] is a 55 y.o. male with PMH as below, here for hospital follow up.  Please see Problem-Based charting for the status of the patient's chronic medical issues.  Patient was admitted to [facility] with chest pain, deemed noncardiac in origin with negative troponins and nuclear stress test without evidence of ischemia.  Patient was restarted on his HCTZ.  He was previously prescribed Glipizide, which patient was not taking and was held on discharge due to A1c 6.4 off of medication.  Today he is doing "great."  He will continue to have occasional chest pain twinges that have greatly improved since starting Protonix.  Overall, he denies chest pain.  He denies SOB, lightheaded, or dizziness.  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  He continues to smoke 1 ppd and "going to try to quit." He states he will see if the [place] can help him pay for the patches.   Past Medical History  Diagnosis Date  . Diabetes mellitus without complication ([place])   . Hypertension   . High cholesterol   . Tobacco use    Current Outpatient Prescriptions  Medication Sig Dispense Refill  . aspirin 81 MG chewable tablet Chew 1 tablet (81 mg total) by mouth daily. 30 tablet 11  . atorvastatin (LIPITOR) 40 MG tablet Take 1 tablet (40 mg total) by mouth daily. 30 tablet 6  . hydrochlorothiazide (HYDRODIURIL) 25 MG tablet Take 25 mg by mouth daily.    Marland Kitchen lisinopril (PRINIVIL,ZESTRIL) 40 MG tablet Take 20 mg by mouth daily.    . Multiple Vitamins-Minerals (MULTIVITAMIN WITH MINERALS) tablet Take 1 tablet by mouth daily.    . nicotine (NICODERM CQ - DOSED IN MG/24 HOURS) 21 mg/24hr patch Place 1 patch (21 mg total) onto the skin daily. 28 patch 0  . pantoprazole (PROTONIX) 40 MG tablet Take 1 tablet (40 mg  total) by mouth daily. 30 tablet 3   No current facility-administered medications for this visit.   Family History  Problem Relation Age of Onset  . Dementia Mother   . Diabetes Father    Social History   Social History  . Marital Status: Single    Spouse Name: N/A  . Number of Children: N/A  . Years of Education: N/A   Occupational History  . Engineer, technical sales, Metallurgist    Social History Main Topics  . Smoking status: Current Every Day Smoker -- 1.00 packs/day for 38 years    Types: Cigarettes  . Smokeless tobacco: Never Used  . Alcohol Use: 25.2 oz/week    42 Standard drinks or equivalent per week     Comment: 6 pack/day  . Drug Use: No  . Sexual Activity: Not on file   Other Topics Concern  . Not on file   Social History Narrative   Pt rents a room from a friend.   Review of Systems: Pertinent items noted in HPI.  Balance of 10 point ROS negative. Objective:  Physical Exam: There were no vitals filed for this visit. Physical Exam  Constitutional: He is oriented to person, place, and time and well-developed, well-nourished, and in no distress. No distress.  HENT:  Head: Normocephalic and atraumatic.  Eyes: EOM are normal. No scleral icterus.  Neck: No JVD present. No tracheal deviation present.  Cardiovascular: Normal rate, regular rhythm and normal heart sounds.   Pulmonary/Chest: Effort normal and breath sounds normal. No stridor. No respiratory distress. He has no wheezes.  Musculoskeletal: He exhibits no edema.  Neurological: He is alert and oriented to person, place, and time.  Skin: Skin is dry. He is not diaphoretic.     Assessment & Plan:   Patient and case were discussed with Dr. Eppie Gibson.  Please refer to Problem Based charting for further documentation.

## 2015-08-10 NOTE — Progress Notes (Signed)
Case discussed with Dr. Lovena Le at the time of the visit.  We reviewed the resident's history and exam and pertinent patient test results.  I agree with the assessment, diagnosis and plan of care documented in the resident's note.

## 2015-08-10 NOTE — Assessment & Plan Note (Signed)
Patient remains 1 ppd smoker.  He is contemplative/preparatory and has previously quit for 1 month until the death of his father.  [facility].   A/P: Patient counseled that tobacco likely cause of his medical problems and we may be able to reduce his other medications if he were to quit.  Patient counseled on strategies. - CTM

## 2015-08-10 NOTE — Assessment & Plan Note (Signed)
Patient continuing to take HCTZ 25 mg daily without side effects or hypotension.   BP Readings from Last 3 Encounters:  08/10/15 129/79  08/01/15 136/86    Lab Results  Component Value Date   NA 140 07/31/2015   K 3.7 07/31/2015   CREATININE 0.99 07/31/2015    Assessment: Blood pressure control:  controlled Progress toward BP goal:    Comments: good control  Plan: Medications:  continue current medications, HCTZ 25 mg daily Educational resources provided:   Self management tools provided:   Other plans: recheck 3 months

## 2015-08-10 NOTE — Assessment & Plan Note (Signed)
Patient was diagnosed with DMII by the [facility].  He was not taking Metformin or Lipitor prior to hospitalization.  A1c while admitted was 6.4.  Medications were held on discharge.  A/P: DMII, well controlled with diet.  No need to restart medications. - CTM

## 2015-08-10 NOTE — Patient Instructions (Signed)
1. Continue HCTZ 25 mg daily. 2. Continue Lipitor 40 mg daily. 3. Continue Protonix 40 mg daily. 4. Continue efforts to stop smoking. 5. You may take Acetaminophen (Tylenol) 500-650 mg 2-3 times per day as needed for neck pain. 6. Return to clinic in 3 months.  Gastroesophageal Reflux Disease, Adult Normally, food travels down the esophagus and stays in the stomach to be digested. However, when a person has gastroesophageal reflux disease (GERD), food and stomach acid move back up into the esophagus. When this happens, the esophagus becomes sore and inflamed. Over time, GERD can create small holes (ulcers) in the lining of the esophagus.  CAUSES This condition is caused by a problem with the muscle between the esophagus and the stomach (lower esophageal sphincter, or LES). Normally, the LES muscle closes after food passes through the esophagus to the stomach. When the LES is weakened or abnormal, it does not close properly, and that allows food and stomach acid to go back up into the esophagus. The LES can be weakened by certain dietary substances, medicines, and medical conditions, including:  Tobacco use.  Pregnancy.  Having a hiatal hernia.  Heavy alcohol use.  Certain foods and beverages, such as coffee, chocolate, onions, and peppermint. RISK FACTORS This condition is more likely to develop in:  People who have an increased body weight.  People who have connective tissue disorders.  People who use NSAID medicines. SYMPTOMS Symptoms of this condition include:  Heartburn.  Difficult or painful swallowing.  The feeling of having a lump in the throat.  Abitter taste in the mouth.  Bad breath.  Having a large amount of saliva.  Having an upset or bloated stomach.  Belching.  Chest pain.  Shortness of breath or wheezing.  Ongoing (chronic) cough or a night-time cough.  Wearing away of tooth enamel.  Weight loss. Different conditions can cause chest pain. Make  sure to see your health care provider if you experience chest pain. DIAGNOSIS Your health care provider will take a medical history and perform a physical exam. To determine if you have mild or severe GERD, your health care provider may also monitor how you respond to treatment. You may also have other tests, including:  An endoscopy toexamine your stomach and esophagus with a small camera.  A test thatmeasures the acidity level in your esophagus.  A test thatmeasures how much pressure is on your esophagus.  A barium swallow or modified barium swallow to show the shape, size, and functioning of your esophagus. TREATMENT The goal of treatment is to help relieve your symptoms and to prevent complications. Treatment for this condition may vary depending on how severe your symptoms are. Your health care provider may recommend:  Changes to your diet.  Medicine.  Surgery. HOME CARE INSTRUCTIONS Diet  Follow a diet as recommended by your health care provider. This may involve avoiding foods and drinks such as:  Coffee and tea (with or without caffeine).  Drinks that containalcohol.  Energy drinks and sports drinks.  Carbonated drinks or sodas.  Chocolate and cocoa.  Peppermint and mint flavorings.  Garlic and onions.  Horseradish.  Spicy and acidic foods, including peppers, chili powder, curry powder, vinegar, hot sauces, and barbecue sauce.  Citrus fruit juices and citrus fruits, such as oranges, lemons, and limes.  Tomato-based foods, such as red sauce, chili, salsa, and pizza with red sauce.  Fried and fatty foods, such as donuts, french fries, potato chips, and high-fat dressings.  High-fat meats, such as  hot dogs and fatty cuts of red and white meats, such as rib eye steak, sausage, ham, and bacon.  High-fat dairy items, such as whole milk, butter, and cream cheese.  Eat small, frequent meals instead of large meals.  Avoid drinking large amounts of liquid with  your meals.  Avoid eating meals during the 2-3 hours before bedtime.  Avoid lying down right after you eat.  Do not exercise right after you eat. General Instructions  Pay attention to any changes in your symptoms.  Take over-the-counter and prescription medicines only as told by your health care provider. Do not take aspirin, ibuprofen, or other NSAIDs unless your health care provider told you to do so.  Do not use any tobacco products, including cigarettes, chewing tobacco, and e-cigarettes. If you need help quitting, ask your health care provider.  Wear loose-fitting clothing. Do not wear anything tight around your waist that causes pressure on your abdomen.  Raise (elevate) the head of your bed 6 inches (15cm).  Try to reduce your stress, such as with yoga or meditation. If you need help reducing stress, ask your health care provider.  If you are overweight, reduce your weight to an amount that is healthy for you. Ask your health care provider for guidance about a safe weight loss goal.  Keep all follow-up visits as told by your health care provider. This is important. SEEK MEDICAL CARE IF:  You have new symptoms.  You have unexplained weight loss.  You have difficulty swallowing, or it hurts to swallow.  You have wheezing or a persistent cough.  Your symptoms do not improve with treatment.  You have a hoarse voice. SEEK IMMEDIATE MEDICAL CARE IF:  You have pain in your arms, neck, jaw, teeth, or back.  You feel sweaty, dizzy, or light-headed.  You have chest pain or shortness of breath.  You vomit and your vomit looks like blood or coffee grounds.  You faint.  Your stool is bloody or black.  You cannot swallow, drink, or eat.   This information is not intended to replace advice given to you by your health care provider. Make sure you discuss any questions you have with your health care provider.   Document Released: 04/17/2005 Document Revised: 03/29/2015  Document Reviewed: 11/02/2014 Elsevier Interactive Patient Education [employer].

## 2015-08-10 NOTE — Assessment & Plan Note (Signed)
Patient's chest pain greatly improved since starting Protonix.  A/P: GERD - Continue Protonix 40 mg daily

## 2015-08-21 ENCOUNTER — Encounter: Payer: Self-pay | Admitting: Physician Assistant

## 2015-08-21 ENCOUNTER — Ambulatory Visit (INDEPENDENT_AMBULATORY_CARE_PROVIDER_SITE_OTHER): Payer: Non-veteran care | Admitting: Physician Assistant

## 2015-08-21 VITALS — BP 110/78 | HR 85 | Ht 65.0 in | Wt 187.0 lb

## 2015-08-21 DIAGNOSIS — R079 Chest pain, unspecified: Secondary | ICD-10-CM

## 2015-08-21 DIAGNOSIS — I10 Essential (primary) hypertension: Secondary | ICD-10-CM

## 2015-08-21 DIAGNOSIS — F17200 Nicotine dependence, unspecified, uncomplicated: Secondary | ICD-10-CM

## 2015-08-21 NOTE — Assessment & Plan Note (Signed)
Smoking cessation discussed. Patient not willing to quit this time.

## 2015-08-21 NOTE — Assessment & Plan Note (Addendum)
Blood pressure is much better controlled. Continue current doses of lisinopril, HCTZ. To follow up in the [place]. recommend 2-D echo in the next couple months but can be done at the [place].

## 2015-08-21 NOTE — Patient Instructions (Signed)
Medication Instructions:  Your physician recommends that you continue on your current medications as directed. Please refer to the Current Medication list given to you today.   Labwork: None ordered  Testing/Procedures: Your physician has requested that you have an echocardiogram to be done at the [facility] Echocardiography is a painless test that uses sound waves to create images of your heart. It provides your doctor with information about the size and shape of your heart and how well your heart's chambers and valves are working. This procedure takes approximately one hour. There are no restrictions for this procedure.    Follow-Up: Your physician recommends that you schedule a follow-up appointment as needed   Any Other Special Instructions Will Be Listed Below (If Applicable).     If you need a refill on your cardiac medications before your next appointment, please call your pharmacy.

## 2015-08-21 NOTE — Progress Notes (Signed)
Cardiology Office Note   Date:  08/21/2015   ID:  Dominic Francis, DOB 1960/09/16, MRN [id number]  PCP:  Hinton Lovely, MD  Cardiologist:   Chief Complaint:    History of Present Illness: Dominic Francis is a 55 y.o. male who presents for this hospital follow-up. He has a history of hypertension, HL D, DM, tobacco use, and EtOH use. He has a long history of chest pain and was re-admitted with recurrent chest pain. Troponins were negative and Dr. Mare Ferrari reviewed the images of his nuclear stress test showed inferior attenuation and apical thinning but no ischemia. Readout showed question of a smaller area of mild reversibility within the anterior septal wall apical segment EF 48% considered intermediate risk finding Blood pressure medications were prescribed and 2-D echo was recommended once blood pressure was controlled.  Patient feeling much better. BP is controlled on medications. He is still smoking a pack of cigarettes daily. He is not taking the nicotine patch. He says he can't afford it but is going to the [place] next week and will talk to them about it. He still drinking about a sixpack a day when he drinks. He says he does not drink every day. He denies any further chest pain. He was to follow-up at the [facility] and not here.  Past Medical History  Diagnosis Date  . Diabetes mellitus without complication ([place])   . Hypertension   . High cholesterol   . Tobacco use     Past Surgical History  Procedure Laterality Date  . Abdominal surgery  approx 1975    part of intestines removed 2nd infection  . Tonsillectomy       Current Outpatient Prescriptions  Medication Sig Dispense Refill  . aspirin 81 MG chewable tablet Chew 1 tablet (81 mg total) by mouth daily. 30 tablet 11  . atorvastatin (LIPITOR) 40 MG tablet Take 1 tablet (40 mg total) by mouth daily. 30 tablet 6  . hydrochlorothiazide (HYDRODIURIL) 25 MG tablet Take 25 mg by mouth daily.    Marland Kitchen lisinopril (PRINIVIL,ZESTRIL) 40 MG tablet Take 20  mg by mouth daily.    . Multiple Vitamins-Minerals (MULTIVITAMIN WITH MINERALS) tablet Take 1 tablet by mouth daily.    . pantoprazole (PROTONIX) 40 MG tablet Take 1 tablet (40 mg total) by mouth daily. 30 tablet 3   No current facility-administered medications for this visit.    Allergies:   Review of patient's allergies indicates no known allergies.    Social History:  The patient  reports that he has been smoking Cigarettes.  He has a 38 pack-year smoking history. He has never used smokeless tobacco. He reports that he drinks about 25.2 oz of alcohol per week. He reports that he does not use illicit drugs.   Family History:  The patient's    family history includes Dementia in his mother; Diabetes in his father.    ROS:  Please see the history of present illness.   Otherwise, review of systems are positive for none.   All other systems are reviewed and negative.    PHYSICAL EXAM: VS:  BP 110/78 mmHg  Pulse 85  Ht 5\' 5"  (1.651 m)  Wt 187 lb (84.823 kg)  BMI 31.12 kg/m2  SpO2 98% , BMI Body mass index is 31.12 kg/(m^2). GEN: Well nourished, well developed, in no acute distress Neck: no JVD, HJR, carotid bruits, or masses Cardiac: RRR; positive S4, no murmurs rubs, thrill or heave,  Respiratory:  clear to auscultation bilaterally, normal  work of breathing GI: soft, nontender, nondistended, + BS MS: no deformity or atrophy Extremities: without cyanosis, clubbing, edema, good distal pulses bilaterally.  Skin: warm and dry, no rash Neuro:  Strength and sensation are intact    EKG:  EKG is not ordered today.    Recent Labs: 07/31/2015: BUN 9; Creatinine, Ser 0.99; Potassium 3.7; Sodium 140; TSH 1.851 08/01/2015: Hemoglobin 16.7; Platelets 174    Lipid Panel    Component Value Date/Time   CHOL 200 07/31/2015 1645   TRIG 182* 07/31/2015 1645   HDL 43 07/31/2015 1645   CHOLHDL 4.7 07/31/2015 1645   VLDL 36 07/31/2015 1645   LDLCALC 121* 07/31/2015 1645      Wt Readings  from Last 3 Encounters:  08/21/15 187 lb (84.823 kg)  08/10/15 191 lb 4.8 oz (86.773 kg)  07/31/15 187 lb 8 oz (85.049 kg)      Other studies Reviewed: Additional studies/ records that were reviewed today include and review of the records demonstrates:  Nuclear stress test 08/01/15 FINDINGS: Perfusion: decreased rest uptake in the inferior wall, possibly related to diaphragmatic attenuation. Smaller area of mild reversibility is suspected within the anterior septal wall, apical segment.   Wall Motion: No focal wall motion abnormality.   Left Ventricular Ejection Fraction: 48 %   End diastolic volume 80 ml   End systolic volume 42 ml   IMPRESSION: 1. Subtle reversibility suspected in the apical segment of the anteroseptal wall.   2. No focal wall motion abnormality.   3. Left ventricular ejection fraction 48%   4. Intermediate-risk stress test findings*.   ASSESSMENT AND PLAN: Essential hypertension Blood pressure is much better controlled. Continue current doses of lisinopril, HCTZ. To follow up in the [place]. recommend 2-D echo in the next couple months but can be done at the [place].  Chest pain Patient has long history of atypical chest pain. He had a nuclear stress test in the hospital that was reviewed by Dr. Mare Ferrari and felt not to be ischemic. See complete details above  Tobacco use disorder Smoking cessation discussed. Patient not willing to quit this time.     Sumner Boast, PA-C  08/21/2015 12:49 PM    [facility] [place], [place], [place]  [address] Phone: [phone]; Fax: [phone]

## 2015-08-21 NOTE — Assessment & Plan Note (Signed)
Patient has long history of atypical chest pain. He had a nuclear stress test in the hospital that was reviewed by Dr. Mare Ferrari and felt not to be ischemic. See complete details above

## 2015-11-16 ENCOUNTER — Telehealth: Payer: Self-pay | Admitting: Internal Medicine

## 2015-11-16 NOTE — Telephone Encounter (Signed)
APPT. REMINDER CALL, NO ANSWER, NO VOICE MAIL °

## 2015-11-17 ENCOUNTER — Encounter: Payer: Self-pay | Admitting: Internal Medicine

## 2015-11-17 ENCOUNTER — Encounter: Payer: Non-veteran care | Admitting: Internal Medicine

## 2018-09-27 ENCOUNTER — Emergency Department (HOSPITAL_COMMUNITY)
Admission: EM | Admit: 2018-09-27 | Discharge: 2018-09-27 | Disposition: A | Discharge status: Home / Self Care | Payer: No Typology Code available for payment source | Attending: Emergency Medicine | Admitting: Emergency Medicine

## 2018-09-27 ENCOUNTER — Other Ambulatory Visit: Payer: Self-pay

## 2018-09-27 ENCOUNTER — Emergency Department (HOSPITAL_COMMUNITY): Payer: No Typology Code available for payment source

## 2018-09-27 DIAGNOSIS — I10 Essential (primary) hypertension: Secondary | ICD-10-CM | POA: Insufficient documentation

## 2018-09-27 DIAGNOSIS — Z79899 Other long term (current) drug therapy: Secondary | ICD-10-CM | POA: Diagnosis not present

## 2018-09-27 DIAGNOSIS — Y929 Unspecified place or not applicable: Secondary | ICD-10-CM | POA: Insufficient documentation

## 2018-09-27 DIAGNOSIS — W19XXXA Unspecified fall, initial encounter: Secondary | ICD-10-CM

## 2018-09-27 DIAGNOSIS — S2232XA Fracture of one rib, left side, initial encounter for closed fracture: Secondary | ICD-10-CM | POA: Diagnosis not present

## 2018-09-27 DIAGNOSIS — Z7982 Long term (current) use of aspirin: Secondary | ICD-10-CM | POA: Insufficient documentation

## 2018-09-27 DIAGNOSIS — Y33XXXA Other specified events, undetermined intent, initial encounter: Secondary | ICD-10-CM | POA: Insufficient documentation

## 2018-09-27 DIAGNOSIS — E119 Type 2 diabetes mellitus without complications: Secondary | ICD-10-CM | POA: Insufficient documentation

## 2018-09-27 DIAGNOSIS — Y939 Activity, unspecified: Secondary | ICD-10-CM | POA: Insufficient documentation

## 2018-09-27 DIAGNOSIS — Y998 Other external cause status: Secondary | ICD-10-CM | POA: Insufficient documentation

## 2018-09-27 DIAGNOSIS — R079 Chest pain, unspecified: Secondary | ICD-10-CM | POA: Diagnosis present

## 2018-09-27 DIAGNOSIS — E7800 Pure hypercholesterolemia, unspecified: Secondary | ICD-10-CM | POA: Insufficient documentation

## 2018-09-27 DIAGNOSIS — F17210 Nicotine dependence, cigarettes, uncomplicated: Secondary | ICD-10-CM | POA: Insufficient documentation

## 2018-09-27 LAB — COMPREHENSIVE METABOLIC PANEL
ALBUMIN: 4.1 g/dL (ref 3.5–5.0)
ALK PHOS: 66 U/L (ref 38–126)
ALT: 44 U/L (ref 0–44)
AST: 37 U/L (ref 15–41)
Anion gap: 14 (ref 5–15)
BUN: 12 mg/dL (ref 6–20)
CALCIUM: 9.3 mg/dL (ref 8.9–10.3)
CO2: 23 mmol/L (ref 22–32)
CREATININE: 1.1 mg/dL (ref 0.61–1.24)
Chloride: 99 mmol/L (ref 98–111)
GFR calc non Af Amer: >60 mL/min (ref >60)
GLUCOSE: 173 mg/dL — AB (ref 70–99)
Potassium: 3.7 mmol/L (ref 3.5–5.1)
SODIUM: 136 mmol/L (ref 135–145)
Total Bilirubin: 0.7 mg/dL (ref 0.3–1.2)
Total Protein: 7.3 g/dL (ref 6.5–8.1)

## 2018-09-27 LAB — CBC WITH DIFFERENTIAL/PLATELET
ABS IMMATURE GRANULOCYTES: 0.06 10*3/uL (ref 0.00–0.07)
Basophils Absolute: 0.1 10*3/uL (ref 0.0–0.1)
Basophils Relative: 1 %
Eosinophils Absolute: 0 10*3/uL (ref 0.0–0.5)
Eosinophils Relative: 0 %
HEMATOCRIT: 51.6 % (ref 39.0–52.0)
HEMOGLOBIN: 16.5 g/dL (ref 13.0–17.0)
IMMATURE GRANULOCYTES: 1 %
LYMPHS ABS: 1.2 10*3/uL (ref 0.7–4.0)
LYMPHS PCT: 12 %
MCH: 29.6 pg (ref 26.0–34.0)
MCHC: 32 g/dL (ref 30.0–36.0)
MCV: 92.6 fL (ref 80.0–100.0)
MONOS PCT: 5 %
Monocytes Absolute: 0.5 10*3/uL (ref 0.1–1.0)
NEUTROS ABS: 8.9 10*3/uL — AB (ref 1.7–7.7)
NEUTROS PCT: 81 %
Platelets: 222 10*3/uL (ref 150–400)
RBC: 5.57 MIL/uL (ref 4.22–5.81)
RDW: 12.9 % (ref 11.5–15.5)
WBC: 10.8 10*3/uL — ABNORMAL HIGH (ref 4.0–10.5)
nRBC: 0 % (ref 0.0–0.2)

## 2018-09-27 LAB — PROTIME-INR
INR: 1.1 (ref 0.8–1.2)
Prothrombin Time: 13.8 seconds (ref 11.4–15.2)

## 2018-09-27 LAB — CBG MONITORING, ED: Glucose-Capillary: 187 mg/dL — ABNORMAL HIGH (ref 70–99)

## 2018-09-27 LAB — BRAIN NATRIURETIC PEPTIDE: B Natriuretic Peptide: 22.1 pg/mL (ref 0.0–100.0)

## 2018-09-27 LAB — TROPONIN I

## 2018-09-27 LAB — MAGNESIUM: Magnesium: 1.9 mg/dL (ref 1.7–2.4)

## 2018-09-27 MED ORDER — TRAMADOL HCL 50 MG PO TABS: 50.0000 mg | ORAL_TABLET | Freq: Four times a day (QID) | ORAL | 0 refills | Status: DC | PRN

## 2018-09-27 MED ORDER — MORPHINE SULFATE (PF) 4 MG/ML IV SOLN
4.0000 mg | Freq: Once | INTRAVENOUS | Status: AC
  Administered 2018-09-27: 4 mg via INTRAVENOUS
  Filled 2018-09-27: qty 1

## 2018-09-27 MED ORDER — DICLOFENAC EPOLAMINE 1.3 % TD PTCH: 1.0000 | MEDICATED_PATCH | Freq: Two times a day (BID) | TRANSDERMAL | 1 refills | Status: DC

## 2018-09-27 MED ORDER — ASPIRIN 81 MG PO CHEW
324.0000 mg | CHEWABLE_TABLET | Freq: Once | ORAL | Status: AC
  Administered 2018-09-27: 324 mg via ORAL
  Filled 2018-09-27: qty 4

## 2018-09-27 MED ORDER — DICLOFENAC EPOLAMINE 1.3 % TD PTCH
1.0000 | MEDICATED_PATCH | Freq: Two times a day (BID) | TRANSDERMAL | Status: DC
  Administered 2018-09-27: 1 via TRANSDERMAL
  Filled 2018-09-27: qty 1

## 2018-09-27 NOTE — ED Provider Notes (Signed)
[facility] EMERGENCY DEPARTMENT Provider Note   CSN: [id number] Arrival date & time: 09/27/18  2831    History   Chief Complaint Chief Complaint  Patient presents with  . Chest Pain    HPI Dominic Francis is a 58 y.o. male.     HPI Presents with left axillary pain. Onset seems to been yesterday, the patient knowledges drinking, does not recall a fall, about the time of onset of pain. Since that time is had pain focally in the left mid axilla, worse with motion, palpation, activity. No other chest pain, no syncope, no vomiting, no fever, no cough. No medication taken for pain relief. Patient has multiple medical issues, including diabetes, hypertension, cigarette addiction. With these concerns, and his chest wall pain, he called EMS, and presents for evaluation. History provided by the patient and EMS providers who noted the patient was awake and alert, hemodynamically unremarkable in route. Past Medical History:  Diagnosis Date  . Diabetes mellitus without complication ([place])   . High cholesterol   . Hypertension   . Tobacco use     Patient Active Problem List   Diagnosis Date Noted  . Chest pain 08/21/2015  . Tobacco use disorder 08/10/2015  . GERD (gastroesophageal reflux disease) 08/10/2015  . Essential hypertension   . Diabetes mellitus without complication ([place])   . Hyperlipidemia     Past Surgical History:  Procedure Laterality Date  . ABDOMINAL SURGERY  approx 1975   part of intestines removed 2nd infection  . TONSILLECTOMY          Home Medications    Prior to Admission medications   Medication Sig Start Date End Date Taking? Authorizing Provider  aspirin 81 MG chewable tablet Chew 1 tablet (81 mg total) by mouth daily. 08/01/15   Norman Herrlich, MD  atorvastatin (LIPITOR) 40 MG tablet Take 1 tablet (40 mg total) by mouth daily. 08/01/15   Norman Herrlich, MD  hydrochlorothiazide (HYDRODIURIL) 25 MG tablet Take 25 mg by mouth daily.     [provider]  lisinopril (PRINIVIL,ZESTRIL) 40 MG tablet Take 20 mg by mouth daily.    [provider]  Multiple Vitamins-Minerals (MULTIVITAMIN WITH MINERALS) tablet Take 1 tablet by mouth daily.    [provider]  pantoprazole (PROTONIX) 40 MG tablet Take 1 tablet (40 mg total) by mouth daily. 08/01/15   Norman Herrlich, MD    Family History Family History  Problem Relation Age of Onset  . Dementia Mother   . Diabetes Father     Social History Social History   Tobacco Use  . Smoking status: Current Every Day Smoker    Packs/day: 1.00    Years: 38.00    Pack years: 38.00    Types: Cigarettes  . Smokeless tobacco: Never Used  Substance Use Topics  . Alcohol use: Yes    Alcohol/week: 42.0 standard drinks    Types: 42 Standard drinks or equivalent per week    Comment: 6 pack/day  . Drug use: No     Allergies   Patient has no known allergies.   Review of Systems Review of Systems  Constitutional:       Per HPI, otherwise negative  HENT:       Per HPI, otherwise negative  Respiratory:       Per HPI, otherwise negative  Cardiovascular:       Per HPI, otherwise negative  Gastrointestinal: Negative for vomiting.  Endocrine:  Negative aside from HPI  Genitourinary:       Neg aside from HPI   Musculoskeletal:       Per HPI, otherwise negative  Skin: Negative.   Neurological: Negative for syncope.     Physical Exam Updated Vital Signs BP 130/82   Pulse 88   Temp 97.9 F (36.6 C) (Oral)   Resp 20   Ht 5\' 5"  (1.651 m)   Wt 90.7 kg   SpO2 99%   BMI 33.28 kg/m   Physical Exam Vitals signs and nursing note reviewed.  Constitutional:      General: He is not in acute distress.    Appearance: He is well-developed.  HENT:     Head: Normocephalic and atraumatic.  Eyes:     Conjunctiva/sclera: Conjunctivae normal.  Cardiovascular:     Rate and Rhythm: Normal rate and regular rhythm.  Pulmonary:     Effort: Pulmonary  effort is normal. No respiratory distress.     Breath sounds: No stridor.  Chest:    Abdominal:     General: There is no distension.  Skin:    General: Skin is warm and dry.  Neurological:     Mental Status: He is alert and oriented to person, place, and time.      ED Treatments / Results  Labs (all labs ordered are listed, but only abnormal results are displayed) Labs Reviewed  COMPREHENSIVE METABOLIC PANEL - Abnormal; Notable for the following components:      Result Value   Glucose, Bld 173 (*)    All other components within normal limits  CBC WITH DIFFERENTIAL/PLATELET - Abnormal; Notable for the following components:   WBC 10.8 (*)    Neutro Abs 8.9 (*)    All other components within normal limits  CBG MONITORING, ED - Abnormal; Notable for the following components:   Glucose-Capillary 187 (*)    All other components within normal limits  MAGNESIUM  TROPONIN I  BRAIN NATRIURETIC PEPTIDE  PROTIME-INR    EKG EKG Interpretation  Date/Time:  Sunday September 27 2018 08:36:15 EDT Ventricular Rate:  94 PR Interval:    QRS Duration: 93 QT Interval:  337 QTC Calculation: 422 R Axis:   89 Text Interpretation:  Sinus rhythm Borderline T wave abnormalities Artifact Abnormal ekg Confirmed by Carmin Muskrat [phone]) on 09/27/2018 8:54:53 AM   Radiology Dg Ribs Unilateral W/chest Left  Result Date: 09/27/2018 CLINICAL DATA:  Possible fall with left axillary pain. EXAM: LEFT RIBS AND CHEST - 3+ VIEW COMPARISON:  07/31/2015 chest radiograph. FINDINGS: Frontal radiograph of the chest and three views of left-sided ribs. Midline trachea. Normal heart size. No pleural effusion or pneumothorax. The Chin overlies the apices minimally. Increased left base scarring or subsegmental atelectasis. Left rib films demonstrate no definite acute fracture. Subtle osseous irregularity involving the eighth posterolateral left rib on the final image, favored to be related to remote trauma. IMPRESSION:  Subtle eighth posterolateral left rib deformity, possibly related to remote trauma. Correlate with point tenderness. No pleural fluid or pneumothorax. Increase in left base scarring or atelectasis. Electronically Signed   By: Abigail Miyamoto M.D.   On: 09/27/2018 11:12    Procedures Procedures (including critical care time)  Medications Ordered in ED Medications  diclofenac (FLECTOR) 1.3 % 1 patch (1 patch Transdermal Patch Applied 09/27/18 1146)  aspirin chewable tablet 324 mg (324 mg Oral Given 09/27/18 0852)  morphine 4 MG/ML injection 4 mg (4 mg Intravenous Given 09/27/18 1110)  Initial Impression / Assessment and Plan / ED Course  I have reviewed the triage vital signs and the nursing notes.  Pertinent labs & imaging results that were available during my care of the patient were reviewed by me and considered in my medical decision making (see chart for details).        12:28 PM Repeat exam patient is awake, alert, sitting upright. He has received diclofenac topical patch, morphine, is in no distress per I reviewed findings with him, including x-ray suggesting fracture, this is where the patient has point tenderness, there is suspicion for fall resulting in rib fracture, but no evidence for pulmonary contusion. Labs, EKG otherwise reassuring, no evidence for concurrent ACS or other acute new pathology. With no ongoing distress, hemodynamic instability, the patient was discharged with analgesia, outpatient follow-up.  Final Clinical Impressions(s) / ED Diagnoses   Final diagnoses:  Fall, initial encounter  Closed fracture of one rib of left side, initial encounter    ED Discharge Orders         Ordered    traMADol (ULTRAM) 50 MG tablet  Every 6 hours PRN,   Status:  Discontinued     09/27/18 1230    diclofenac (FLECTOR) 1.3 % PTCH  2 times daily,   Status:  Discontinued     09/27/18 1230    diclofenac (FLECTOR) 1.3 % PTCH  2 times daily,   Status:  Discontinued     09/27/18  1231    traMADol (ULTRAM) 50 MG tablet  Every 6 hours PRN,   Status:  Discontinued     09/27/18 1231    traMADol (ULTRAM) 50 MG tablet  Every 6 hours PRN     09/27/18 1236    diclofenac (FLECTOR) 1.3 % PTCH  2 times daily     09/27/18 1236           Carmin Muskrat, MD 09/27/18 1549

## 2018-09-27 NOTE — ED Triage Notes (Signed)
Pt arrives with upper chest/side pain with some inspiratory SOB since last night. Radiates to front and back.

## 2018-09-27 NOTE — ED Notes (Signed)
Pt returns from radiology. 

## 2018-09-27 NOTE — Discharge Instructions (Addendum)
As discussed, your evaluation today has been largely reassuring.  But, it is important that you monitor your condition carefully, and do not hesitate to return to the ED if you develop new, or concerning changes in your condition.  If you are unable to fill the diclofenac prescription, please use over-the-counter patches including methyl salicylate and lidocaine as an alternative.  Otherwise, please follow-up with your physician for appropriate ongoing care.

## 2019-01-05 ENCOUNTER — Emergency Department (HOSPITAL_BASED_OUTPATIENT_CLINIC_OR_DEPARTMENT_OTHER)
Admission: EM | Admit: 2019-01-05 | Discharge: 2019-01-05 | Disposition: A | Discharge status: Home / Self Care | Payer: No Typology Code available for payment source | Attending: Emergency Medicine | Admitting: Emergency Medicine

## 2019-01-05 ENCOUNTER — Other Ambulatory Visit: Payer: Self-pay

## 2019-01-05 ENCOUNTER — Encounter (HOSPITAL_BASED_OUTPATIENT_CLINIC_OR_DEPARTMENT_OTHER): Payer: Self-pay | Admitting: *Deleted

## 2019-01-05 DIAGNOSIS — M79646 Pain in unspecified finger(s): Secondary | ICD-10-CM

## 2019-01-05 DIAGNOSIS — E119 Type 2 diabetes mellitus without complications: Secondary | ICD-10-CM | POA: Insufficient documentation

## 2019-01-05 DIAGNOSIS — I10 Essential (primary) hypertension: Secondary | ICD-10-CM | POA: Insufficient documentation

## 2019-01-05 DIAGNOSIS — M79644 Pain in right finger(s): Secondary | ICD-10-CM | POA: Insufficient documentation

## 2019-01-05 DIAGNOSIS — R202 Paresthesia of skin: Secondary | ICD-10-CM | POA: Diagnosis not present

## 2019-01-05 DIAGNOSIS — F17210 Nicotine dependence, cigarettes, uncomplicated: Secondary | ICD-10-CM | POA: Insufficient documentation

## 2019-01-05 NOTE — Discharge Instructions (Signed)
As we discussed, your exam is reassuring here.  As we discussed, this could be something called ray nods phenomenon.  He is closely monitor your symptoms and if you have any repeat episodes or your fingers get discolored, numbness or weakness, return emergency department  Otherwise follow-up with your primary care doctor.

## 2019-01-05 NOTE — ED Triage Notes (Signed)
While at work his right middle finger, thumb and his left little finger got numb and turned blue. Work made him come here.

## 2019-01-05 NOTE — ED Provider Notes (Signed)
[facility] EMERGENCY DEPARTMENT Provider Note   CSN: [id number] Arrival date & time: 01/05/19  1846    History   Chief Complaint Chief Complaint  Patient presents with  . Hand Pain    HPI Dominic Francis is a 58 y.o. male with PMH/o DM, HTN, Smokers, who presents evaluation of pain to right third digit, thumb and left fifth digit as well as discoloration and paresthesia.  He reports that this occurred while he was at work earlier this afternoon.  He states that he works on [employer] and was outside when it was raining and cold.  He was not wearing gloves at the time.  He reports that all of a sudden, his right third and thumb and left fifth digit became blue, cold and numb.  He states that he did not have any trauma or injury to the fingers.  He states that since then, the symptoms have improved.  On ED arrival, he states that they are not back to normal and he currently does not have any symptoms.  He states that this is never happened to him before.     The history is provided by the patient.    Past Medical History:  Diagnosis Date  . Diabetes mellitus without complication ([place])   . High cholesterol   . Hypertension   . Tobacco use     Patient Active Problem List   Diagnosis Date Noted  . Chest pain 08/21/2015  . Tobacco use disorder 08/10/2015  . GERD (gastroesophageal reflux disease) 08/10/2015  . Essential hypertension   . Diabetes mellitus without complication ([place])   . Hyperlipidemia     Past Surgical History:  Procedure Laterality Date  . ABDOMINAL SURGERY  approx 1975   part of intestines removed 2nd infection  . TONSILLECTOMY          Home Medications    Prior to Admission medications   Medication Sig Start Date End Date Taking? Authorizing Provider  aspirin 81 MG chewable tablet Chew 1 tablet (81 mg total) by mouth daily. 08/01/15   Norman Herrlich, MD  atorvastatin (LIPITOR) 40 MG tablet Take 1 tablet (40 mg total) by mouth daily. 08/01/15    Norman Herrlich, MD  diclofenac (FLECTOR) 1.3 % [facility] Place 1 patch onto the skin 2 (two) times daily. 09/27/18   Carmin Muskrat, MD  hydrochlorothiazide (HYDRODIURIL) 25 MG tablet Take 25 mg by mouth daily.    [provider]  lisinopril (PRINIVIL,ZESTRIL) 40 MG tablet Take 20 mg by mouth daily.    [provider]  Multiple Vitamins-Minerals (MULTIVITAMIN WITH MINERALS) tablet Take 1 tablet by mouth daily.    [provider]  pantoprazole (PROTONIX) 40 MG tablet Take 1 tablet (40 mg total) by mouth daily. 08/01/15   Norman Herrlich, MD  traMADol (ULTRAM) 50 MG tablet Take 1 tablet (50 mg total) by mouth every 6 (six) hours as needed. 09/27/18   Carmin Muskrat, MD    Family History Family History  Problem Relation Age of Onset  . Dementia Mother   . Diabetes Father     Social History Social History   Tobacco Use  . Smoking status: Current Every Day Smoker    Packs/day: 1.00    Years: 38.00    Pack years: 38.00    Types: Cigarettes  . Smokeless tobacco: Never Used  Substance Use Topics  . Alcohol use: Yes    Alcohol/week: 42.0 standard drinks    Types: 42 Standard drinks or  equivalent per week    Comment: 6 pack/day  . Drug use: No     Allergies   Patient has no known allergies.   Review of Systems Review of Systems  Musculoskeletal:       Finger pain  Skin: Positive for color change.  Neurological: Positive for numbness.  All other systems reviewed and are negative.    Physical Exam Updated Vital Signs BP (!) 158/98   Pulse 77   Temp 98.3 F (36.8 C) (Oral)   Resp 20   Ht 5\' 5"  (1.651 m)   Wt 87.8 kg   SpO2 98%   BMI 32.22 kg/m   Physical Exam Vitals signs and nursing note reviewed.  Constitutional:      Appearance: He is well-developed.  HENT:     Head: Normocephalic and atraumatic.  Eyes:     General: No scleral icterus.       Right eye: No discharge.        Left eye: No discharge.     Conjunctiva/sclera: Conjunctivae  normal.  Cardiovascular:     Pulses:          Radial pulses are 2+ on the right side and 2+ on the left side.  Pulmonary:     Effort: Pulmonary effort is normal.  Musculoskeletal:     Comments: Full range of motion of all 5 digits of bilateral upper extremities intact without any difficulty.  No tenderness palpation.  Skin:    General: Skin is warm and dry.     Capillary Refill: Capillary refill takes less than 2 seconds.     Comments: Good distal cap refill. BUE are not dusky in appearance or cool to touch.  No discoloration noted of right third digit, thumb, left fifth digit.  Neurological:     Mental Status: He is alert.     Comments: Equal grip strength bilaterally. Sensation intact along major nerve distributions of BUE.   Psychiatric:        Speech: Speech normal.        Behavior: Behavior normal.      ED Treatments / Results  Labs (all labs ordered are listed, but only abnormal results are displayed) Labs Reviewed - No data to display  EKG None  Radiology No results found.  Procedures Procedures (including critical care time)  Medications Ordered in ED Medications - No data to display   Initial Impression / Assessment and Plan / ED Course  I have reviewed the triage vital signs and the nursing notes.  Pertinent labs & imaging results that were available during my care of the patient were reviewed by me and considered in my medical decision making (see chart for details).        58 y.o. M with history of diabetes who presents for evaluation of pain, numbness, discoloration to his right third digit and thumb and left fifth digit while at work.  Symptoms have resolved since coming to the ED.  Currently with no complaints. Patient is afebrile, non-toxic appearing, sitting comfortably on examination table. Vital signs reviewed and stable.  On exam, he has good radial and ulnar pulses bilaterally without any difficulty.  Good cap refill with no obvious signs of  discoloration.  Bilateral upper extremities are not dusky in appearance or cool to touch.  Question ray nods phenomenon.  History/physical exam not concerning for ischemic limbs, acute arterial embolism, DVTs. At this time, patient exhibits no emergent life-threatening condition that require further evaluation in ED or admission.  Encourage follow-up with primary care doctor. Patient had ample opportunity for questions and discussion. All patient's questions were answered with full understanding. Strict return precautions discussed. Patient expresses understanding and agreement to plan.   Portions of this note were generated with [employer]. Dictation errors may occur despite best attempts at proofreading.   Final Clinical Impressions(s) / ED Diagnoses   Final diagnoses:  Pain of finger, unspecified laterality  Paresthesia    ED Discharge Orders    None       Desma Mcgregor 01/05/19 2025    Virgel Manifold, MD 01/06/19 [phone]

## 2019-12-28 ENCOUNTER — Emergency Department (HOSPITAL_COMMUNITY): Payer: No Typology Code available for payment source

## 2019-12-28 ENCOUNTER — Encounter (HOSPITAL_COMMUNITY): Payer: Self-pay

## 2019-12-28 ENCOUNTER — Other Ambulatory Visit: Payer: Self-pay

## 2019-12-28 ENCOUNTER — Emergency Department (HOSPITAL_COMMUNITY)
Admission: EM | Admit: 2019-12-28 | Discharge: 2019-12-28 | Disposition: A | Discharge status: Home / Self Care | Payer: No Typology Code available for payment source | Attending: Emergency Medicine | Admitting: Emergency Medicine

## 2019-12-28 DIAGNOSIS — F17210 Nicotine dependence, cigarettes, uncomplicated: Secondary | ICD-10-CM | POA: Diagnosis not present

## 2019-12-28 DIAGNOSIS — E119 Type 2 diabetes mellitus without complications: Secondary | ICD-10-CM | POA: Diagnosis not present

## 2019-12-28 DIAGNOSIS — D1800 Hemangioma unspecified site: Secondary | ICD-10-CM | POA: Diagnosis not present

## 2019-12-28 DIAGNOSIS — R55 Syncope and collapse: Secondary | ICD-10-CM

## 2019-12-28 DIAGNOSIS — I10 Essential (primary) hypertension: Secondary | ICD-10-CM | POA: Diagnosis not present

## 2019-12-28 DIAGNOSIS — R42 Dizziness and giddiness: Secondary | ICD-10-CM | POA: Diagnosis present

## 2019-12-28 DIAGNOSIS — Z7982 Long term (current) use of aspirin: Secondary | ICD-10-CM | POA: Diagnosis not present

## 2019-12-28 DIAGNOSIS — Z79899 Other long term (current) drug therapy: Secondary | ICD-10-CM | POA: Diagnosis not present

## 2019-12-28 LAB — BASIC METABOLIC PANEL
Anion gap: 14 (ref 5–15)
BUN: 12 mg/dL (ref 6–20)
CO2: 24 mmol/L (ref 22–32)
Calcium: 9 mg/dL (ref 8.9–10.3)
Chloride: 101 mmol/L (ref 98–111)
Creatinine, Ser: 0.87 mg/dL (ref 0.61–1.24)
GFR calc Af Amer: >60 mL/min (ref >60)
GFR calc non Af Amer: >60 mL/min (ref >60)
Glucose, Bld: 162 mg/dL — ABNORMAL HIGH (ref 70–99)
Potassium: 4.1 mmol/L (ref 3.5–5.1)
Sodium: 139 mmol/L (ref 135–145)

## 2019-12-28 LAB — URINALYSIS, ROUTINE W REFLEX MICROSCOPIC
Bilirubin Urine: NEGATIVE
Glucose, UA: NEGATIVE mg/dL
Hgb urine dipstick: NEGATIVE
Ketones, ur: NEGATIVE mg/dL
Leukocytes,Ua: NEGATIVE
Nitrite: NEGATIVE
Protein, ur: NEGATIVE mg/dL
Specific Gravity, Urine: 1.012 (ref 1.005–1.030)
pH: 5 (ref 5.0–8.0)

## 2019-12-28 LAB — CBC
HCT: 48.8 % (ref 39.0–52.0)
Hemoglobin: 16.5 g/dL (ref 13.0–17.0)
MCH: 31.4 pg (ref 26.0–34.0)
MCHC: 33.8 g/dL (ref 30.0–36.0)
MCV: 92.8 fL (ref 80.0–100.0)
Platelets: 170 10*3/uL (ref 150–400)
RBC: 5.26 MIL/uL (ref 4.22–5.81)
RDW: 13.2 % (ref 11.5–15.5)
WBC: 7.7 10*3/uL (ref 4.0–10.5)
nRBC: 0 % (ref 0.0–0.2)

## 2019-12-28 MED ORDER — GADOBUTROL 1 MMOL/ML IV SOLN
8.8000 mL | Freq: Once | INTRAVENOUS | Status: AC | PRN
  Administered 2019-12-28: 8.8 mL via INTRAVENOUS

## 2019-12-28 MED ORDER — SODIUM CHLORIDE 0.9% FLUSH: 3.0000 mL | Freq: Once | INTRAVENOUS | Status: DC

## 2019-12-28 NOTE — ED Triage Notes (Signed)
Pt reports while at work earlier today he suddenly felt dizzy and starting seeing black and white spots, states 'I felt like I was dying" episode lasted a few seconds. Pt denies any symptoms at this time, no numbness, weakness or facial droop noted. Pt a.o

## 2019-12-28 NOTE — ED Provider Notes (Signed)
[facility] EMERGENCY DEPARTMENT Provider Note   CSN: [id number] Arrival date & time: 12/28/19  1655     History Chief Complaint  Patient presents with  . Dizziness    Dominic Francis is a 59 y.o. male.  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 No pain, no headache, no chest pain, no shortness of breath.  Felt dizzy and then saw black and white then cleared back to normal. No heart racing that he could say.  No nausea/vomiting.  No fevers no cough no urinary symptoms no diarrhea or black or bloody stools No change in medications but sometimes forgets to take them over the weekend   Past Medical History:  Diagnosis Date  . Diabetes mellitus without complication ([place])   . High cholesterol   . Hypertension   . Tobacco use     Patient Active Problem List   Diagnosis Date Noted  . Chest pain 08/21/2015  . Tobacco use disorder 08/10/2015  . GERD (gastroesophageal reflux disease) 08/10/2015  . Essential hypertension   . Diabetes mellitus without complication ([place])   . Hyperlipidemia     Past Surgical History:  Procedure Laterality Date  . ABDOMINAL SURGERY  approx 1975   part of intestines removed 2nd infection  . TONSILLECTOMY         Family History  Problem Relation Age of Onset  . Dementia Mother   . Diabetes Father     Social History   Tobacco Use  . Smoking status: Current Every Day Smoker    Packs/day: 1.00    Years: 38.00    Pack years: 38.00    Types: Cigarettes  . Smokeless tobacco: Never Used  Substance Use Topics  . Alcohol use: Yes    Alcohol/week: 42.0 standard drinks    Types: 42 Standard drinks or  equivalent per week    Comment: 6 pack/day  . Drug use: No    Home Medications Prior to Admission medications   Medication Sig Start Date End Date Taking? Authorizing Provider  aspirin 81 MG chewable tablet Chew 1 tablet (81 mg total) by mouth daily. 08/01/15  Yes Norman Herrlich, MD  hydrochlorothiazide (HYDRODIURIL) 25 MG tablet Take 25 mg by mouth daily.   Yes [provider]  lisinopril (PRINIVIL,ZESTRIL) 40 MG tablet Take 20 mg by mouth daily.   Yes [provider]  Multiple Vitamins-Minerals (MULTIVITAMIN WITH MINERALS) tablet Take 1 tablet by mouth daily.   Yes [provider]  sildenafil (REVATIO) 20 MG tablet Take 50 mg by mouth once as needed.   Yes [provider]  atorvastatin (LIPITOR) 40 MG tablet Take 1 tablet (40 mg total) by mouth daily. Patient not taking: Reported on 12/28/2019 08/01/15   Norman Herrlich, MD  diclofenac (FLECTOR) 1.3 % [facility] 1 patch onto the skin 2 (two) times daily. Patient not taking: Reported on 12/28/2019 09/27/18   Carmin Muskrat, MD  pantoprazole (PROTONIX) 40 MG tablet Take 1 tablet (40 mg total) by mouth daily. Patient not taking: Reported on 12/28/2019 08/01/15   Norman Herrlich, MD  traMADol (ULTRAM) 50 MG tablet Take 1 tablet (50 mg total) by mouth  every 6 (six) hours as needed. 09/27/18   Carmin Muskrat, MD    Allergies    Drug class [morphine and related]  Review of Systems   Review of Systems  Constitutional: Negative for fever.  HENT: Negative for sore throat.   Eyes: Negative for visual disturbance.  Respiratory: Negative for cough and shortness of breath.   Cardiovascular: Negative for chest pain.  Gastrointestinal: Negative for abdominal pain, diarrhea, nausea and vomiting.  Genitourinary: Negative for difficulty urinating.  Musculoskeletal: Negative for back pain and neck stiffness.  Skin: Negative for rash.  Neurological: Positive for dizziness and light-headedness. Negative for syncope, facial  asymmetry, speech difficulty, weakness, numbness (tingling in right hand from elbow) and headaches.    Physical Exam Updated Vital Signs BP (!) 156/98   Pulse 76   Temp 98.2 F (36.8 C) (Oral)   Resp 16   SpO2 97%   Physical Exam Vitals and nursing note reviewed.  Constitutional:      General: He is not in acute distress.    Appearance: He is well-developed. He is not diaphoretic.  HENT:     Head: Normocephalic and atraumatic.  Eyes:     Conjunctiva/sclera: Conjunctivae normal.  Cardiovascular:     Rate and Rhythm: Normal rate and regular rhythm.     Heart sounds: Normal heart sounds. No murmur. No friction rub. No gallop.   Pulmonary:     Effort: Pulmonary effort is normal. No respiratory distress.     Breath sounds: Normal breath sounds. No wheezing or rales.  Abdominal:     General: There is no distension.     Palpations: Abdomen is soft.     Tenderness: There is no abdominal tenderness. There is no guarding.  Musculoskeletal:     Cervical back: Normal range of motion.  Skin:    General: Skin is warm and dry.  Neurological:     Mental Status: He is alert and oriented to person, place, and time.     ED Results / Procedures / Treatments   Labs (all labs ordered are listed, but only abnormal results are displayed) Labs Reviewed  BASIC METABOLIC PANEL - Abnormal; Notable for the following components:      Result Value   Glucose, Bld 162 (*)    All other components within normal limits  CBC  URINALYSIS, ROUTINE W REFLEX MICROSCOPIC    EKG None  Radiology CT HEAD WO CONTRAST  Result Date: 12/28/2019 CLINICAL DATA:  59 year old male with history of sudden onset of dizziness earlier today. EXAM: CT HEAD WITHOUT CONTRAST TECHNIQUE: Contiguous axial images were obtained from the base of the skull through the vertex without intravenous contrast. COMPARISON:  None. FINDINGS: Brain: In the right frontal region there is a small ill-defined area of high attenuation  measuring 9 mm (axial image 12 of series 3). Mild cerebral atrophy. Patchy and confluent areas of decreased attenuation are noted throughout the deep and periventricular white matter of the cerebral hemispheres bilaterally, compatible with chronic microvascular ischemic disease. No evidence of acute infarction, hydrocephalus, extra-axial collection or mass lesion/mass effect. Vascular: No hyperdense vessel or unexpected calcification. Skull: Normal. Negative for fracture or focal lesion. Sinuses/Orbits: No acute finding. Other: None. IMPRESSION: 1. Small ill-defined area of high attenuation in the right frontal region is of uncertain etiology and significance. This is new compared to remote prior examination from 2005. Differential considerations include a small amount of chronic white matter calcifications, a small lesion such as a cavernoma, or small amount of  hemorrhage from small-vessel white matter infarction. Further evaluation with brain MRI with and without IV gadolinium could be considered if clinically appropriate. 2. Mild cerebral atrophy with change chronic ischemic changes in the cerebral white matter, as above. Critical Value/emergent results were called by telephone at the time of interpretation on 12/28/2019 at 6:38 pm to provider Carmin Muskrat, who verbally acknowledged these results. Electronically Signed   By: Vinnie Langton M.D.   On: 12/28/2019 18:38   MR BRAIN WO CONTRAST  Result Date: 12/28/2019 CLINICAL DATA:  Ataxia and encephalopathy EXAM: MRI HEAD WITHOUT CONTRAST TECHNIQUE: Multiplanar, multiecho pulse sequences of the brain and surrounding structures were obtained without intravenous contrast. COMPARISON:  None. FINDINGS: Brain: No acute infarct, acute hemorrhage or extra-axial collection. Multifocal white matter hyperintensity, most commonly due to chronic ischemic microangiopathy. Normal volume of CSF spaces. There is a focus of magnetic susceptibility effect within the right  frontal white matter, likely a cavernous angioma. Normal midline structures. Vascular: Normal flow voids. Skull and upper cervical spine: Normal marrow signal. Sinuses/Orbits: Negative. Other: None. IMPRESSION: 1. No acute intracranial abnormality. 2. Multifocal white matter hyperintensity, most commonly due to chronic ischemic microangiopathy. 3. Right frontal cavernous angioma. Electronically Signed   By: Ulyses Jarred M.D.   On: 12/28/2019 21:04    Procedures Procedures (including critical care time)  Medications Ordered in ED Medications  gadobutrol (GADAVIST) 1 MMOL/ML injection 8.8 mL (8.8 mLs Intravenous Contrast Given 12/28/19 2025)    ED Course  I have reviewed the triage vital signs and the nursing notes.  Pertinent labs & imaging results that were available during my care of the patient were reviewed by me and considered in my medical decision making (see chart for details).    MDM Rules/Calculators/A&P                      59yo male with history of DM, htn, hlpd presents with concern for near-syncopal event while at work today.  DDx for near-syncope includes cardiac arrhythmia, MI, PE, electrolyte abnormality, hypovolemia including dehydration and anemia/GI bleed, infection, vasovagal or stress.   CT ordered in triage for dizziness with small area of ill defined high attenuation and MR WWO contrast ordered.  MRI shows no acute intracranial abnormalities, white matter hyperintensity likely due to chronic ischemic microangiopathy and right frontal cavernous angioma.  Gave number for NSU follow up for cavernous angioma or recommend discussing with PCP.     Pt hemodynamically stable, afebrile, and without hx of infectious symptoms.  Urinalysis shows no sign of infection.CBC showed no sign of anemia.  Electrolytes WNL.  EKG was not changed from prior, no sign of arrhythmia, patient does not have chest pain, dyspnea or palpitations and have low suspicion for PE or cardiac etiology of  symptoms.  Symptoms possibly related to dehydration or vasovagal in response to stress--however, recommend PCP follow up to determine further evaluation. Patient discharged in stable condition with understanding of reasons to return.      Final Clinical Impression(s) / ED Diagnoses Final diagnoses:  Cavernous angioma  Near syncope    Rx / DC Orders ED Discharge Orders    None       Gareth Morgan, MD 12/29/19 [phone]

## 2019-12-28 NOTE — ED Notes (Signed)
Patient transported to MRI 

## 2020-10-08 IMAGING — MR MR HEAD W/O CM
13 of 14 series · 44 of 48 positions shown · non-contrast
Comparison: None.

CLINICAL DATA: Ataxia and encephalopathy

EXAM:
MRI HEAD WITHOUT CONTRAST
TECHNIQUE: Multiplanar, multiecho pulse sequences of the brain and surrounding
structures were obtained without intravenous contrast.

[Series 5: DWI · axial · 3.0mm · 0.88mm/px · z∈[-103,+43]mm · 6 of 100 slices shown (1 of 4)]
[im 1/100]
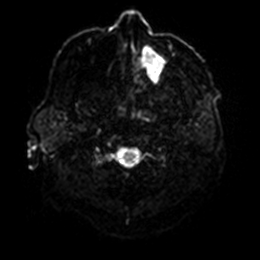
[im 20/100]
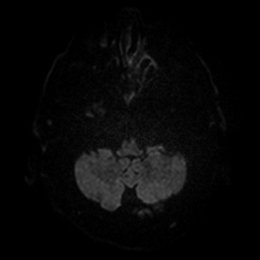
[im 40/100]
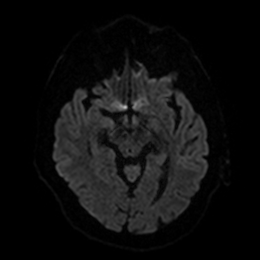
[im 60/100]
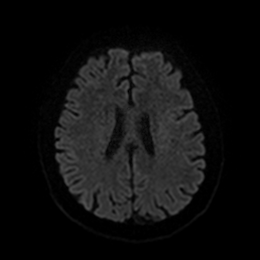
[im 80/100]
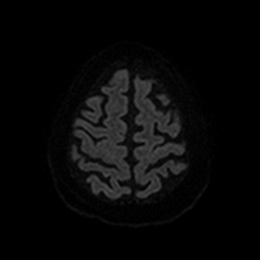
[im 100/100]
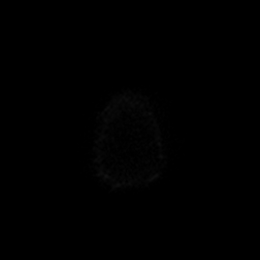

[Series 6: DWI · axial · 3.0mm · 0.88mm/px · z∈[-103,+43]mm · 4 of 50 slices shown (2 of 4)]
[im 1/50]
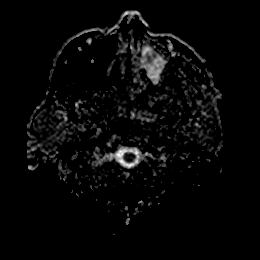
[im 17/50]
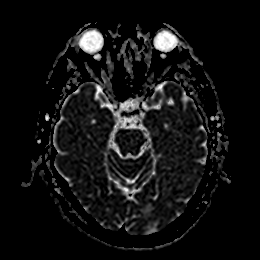
[im 33/50]
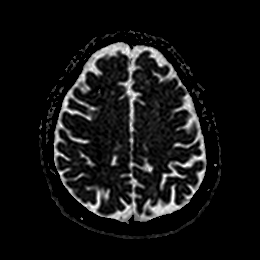
[im 50/50]
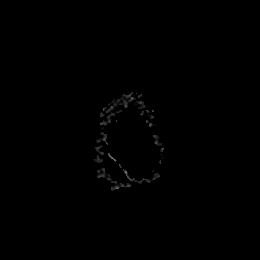

[Series 7: DWI · coronal · 4.0mm · 0.88mm/px · 6 of 80 slices shown (3 of 4)]
[im 1/80]
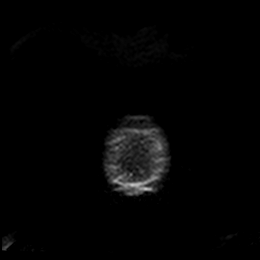
[im 16/80]
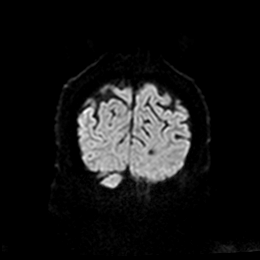
[im 32/80]
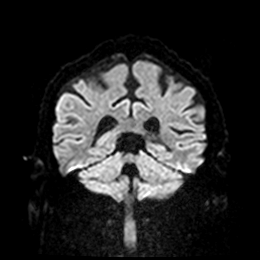
[im 48/80]
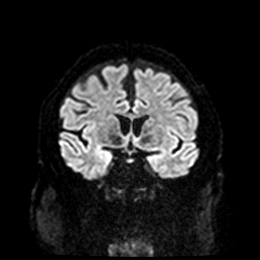
[im 64/80]
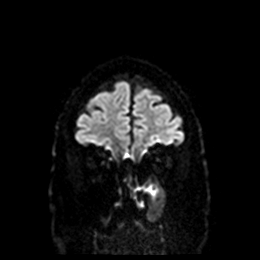
[im 80/80]
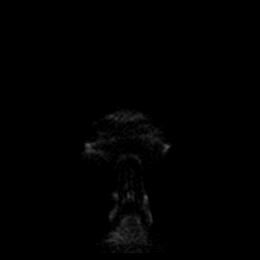

[Series 8: DWI · coronal · 4.0mm · 0.88mm/px · 3 of 40 slices shown (4 of 4)]
[im 1/40]
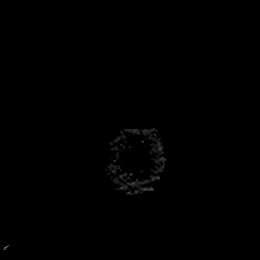
[im 20/40]
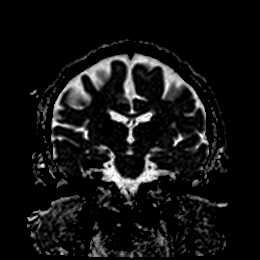
[im 40/40]
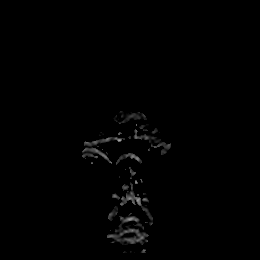

[Series 9: T1 · sagittal · 5.0mm · 0.94mm/px · 2 of 25 slices shown]
[im 1/25]
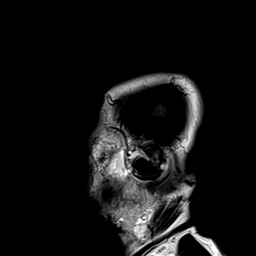
[im 25/25]
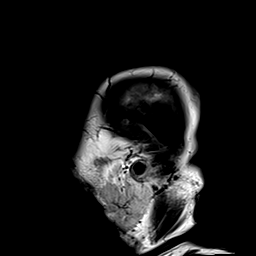

[Series 10: T2 · axial · 5.0mm · 0.90mm/px · z∈[-115,+47]mm · 2 of 28 slices shown (1 of 2)]
[im 1/28]
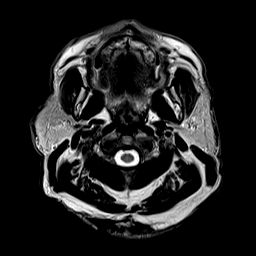
[im 28/28]
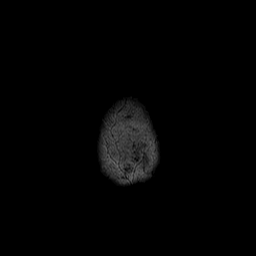

[Series 11: FLAIR · axial · 5.0mm · 0.45mm/px · z∈[-116,+45]mm · 2 of 28 slices shown]
[im 1/28]
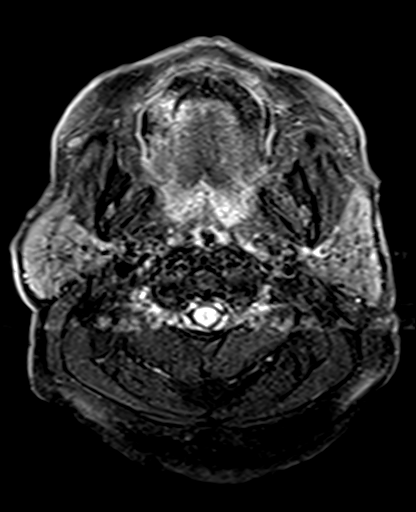
[im 28/28]
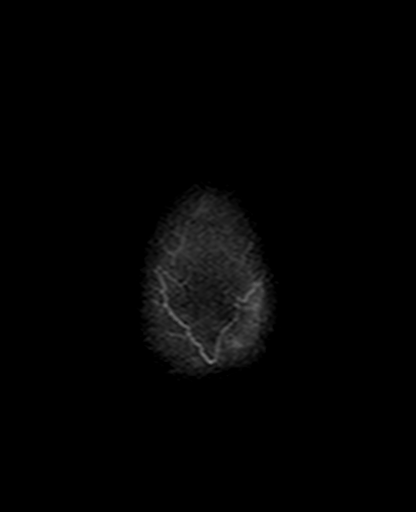

[Series 12: mag_images · axial · 3.0mm · 0.90mm/px · z∈[-110,+42]mm · 4 of 52 slices shown]
[im 1/52]
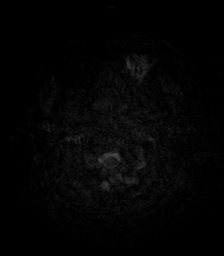
[im 18/52]
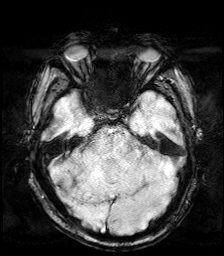
[im 35/52]
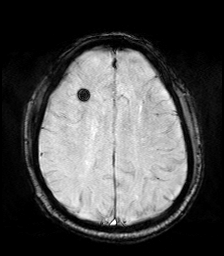
[im 52/52]
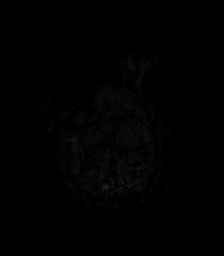

[Series 13: pha_images · axial · 3.0mm · 0.90mm/px · z∈[-110,+42]mm · 4 of 52 slices shown]
[im 1/52]
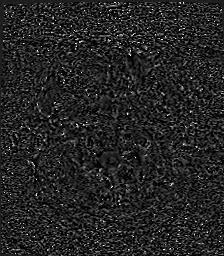
[im 18/52]
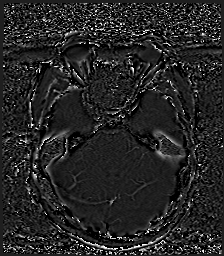
[im 35/52]
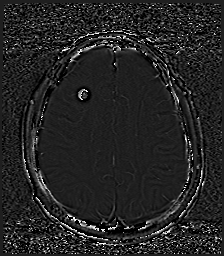
[im 52/52]
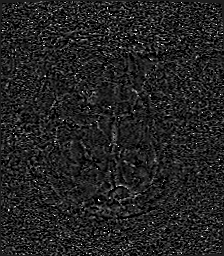

[Series 14: swi_images · axial · 3.0mm · 0.90mm/px · z∈[-110,+42]mm · 4 of 52 slices shown]
[im 1/52]
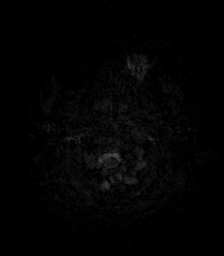
[im 18/52]
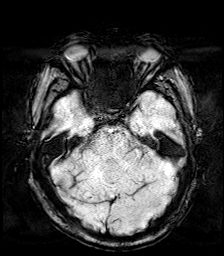
[im 35/52]
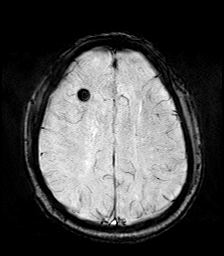
[im 52/52]
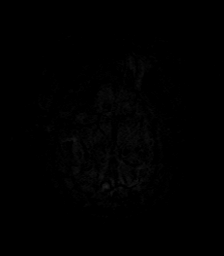

[Series 15: mip_images(sw) · axial · 24.0mm · 0.90mm/px · z∈[-99,+32]mm · 3 of 45 slices shown]
[im 1/45]
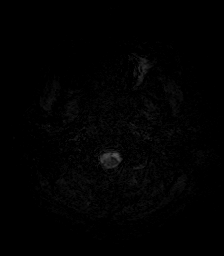
[im 23/45]
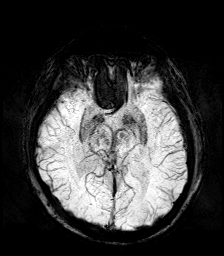
[im 45/45]
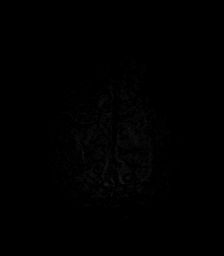

[Series 17: T2 · coronal · 5.0mm · 0.90mm/px · 2 of 31 slices shown (2 of 2)]
[im 1/31]
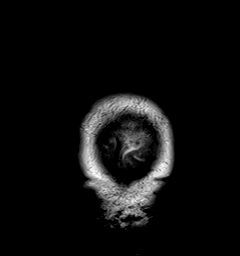
[im 31/31]
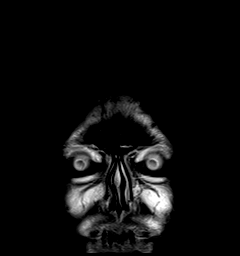

[Series 17: T2 post-contrast · coronal · 5.0mm · 0.90mm/px · 2 of 31 slices shown]
[im 1/31]
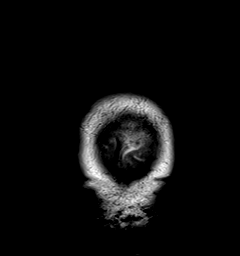
[im 31/31]
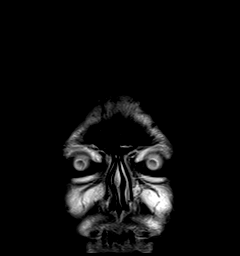

[44 of 48 positions shown; findings below may reference images not displayed]

FINDINGS: Brain: No acute infarct, acute hemorrhage or extra-axial collection.
Multifocal white matter hyperintensity, most commonly due to chronic
ischemic microangiopathy. Normal volume of CSF spaces. There is a
focus of magnetic susceptibility effect within the right frontal
white matter, likely a cavernous angioma. Normal midline structures.

Vascular: Normal flow voids.

Skull and upper cervical spine: Normal marrow signal.

Sinuses/Orbits: Negative.

Other: None.
IMPRESSION: 1. No acute intracranial abnormality.
2. Multifocal white matter hyperintensity, most commonly due to
chronic ischemic microangiopathy.
3. Right frontal cavernous angioma.

## 2022-02-21 ENCOUNTER — Ambulatory Visit (INDEPENDENT_AMBULATORY_CARE_PROVIDER_SITE_OTHER): Payer: No Typology Code available for payment source | Admitting: Pulmonary Disease

## 2022-02-21 ENCOUNTER — Encounter: Payer: Self-pay | Admitting: Pulmonary Disease

## 2022-02-21 VITALS — BP 124/68 | HR 72 | Temp 97.9°F | Ht 65.0 in | Wt 203.0 lb

## 2022-02-21 DIAGNOSIS — R918 Other nonspecific abnormal finding of lung field: Secondary | ICD-10-CM

## 2022-02-21 DIAGNOSIS — R590 Localized enlarged lymph nodes: Secondary | ICD-10-CM | POA: Diagnosis not present

## 2022-02-21 DIAGNOSIS — F17200 Nicotine dependence, unspecified, uncomplicated: Secondary | ICD-10-CM | POA: Diagnosis not present

## 2022-02-21 DIAGNOSIS — C189 Malignant neoplasm of colon, unspecified: Secondary | ICD-10-CM | POA: Diagnosis not present

## 2022-02-21 DIAGNOSIS — C787 Secondary malignant neoplasm of liver and intrahepatic bile duct: Secondary | ICD-10-CM

## 2022-02-21 NOTE — Progress Notes (Signed)
Synopsis: Referred in 02/2022 for pulmonary nodules and mediastinal adenopathy.  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  At the time of diagnosis he was noted to pulmonary nodules which were no larger than 4 mm and not FDG avid however he had large mediastinal adenopathy which was FDG avid on PET scanning.  Subjective:   PATIENT ID: Dominic Francis GENDER: male DOB: 08-26-1960, MRN: [id number]   HPI  Chief Complaint  Patient presents with   Pulmonary Consult    This is a pleasant 61 year old male who smokes 1 pack of cigarettes daily and has done so since age 24 who presents to my clinic today for consideration of a bronchoscopy.  Unfortunately Dominic Francis was diagnosed with invasive adenocarcinoma via a colonoscopy this summer.  He was noted to have a pelvic mass, colonoscopy was performed which showed adenocarcinoma.  A PET CT scan was performed on July 18 and showed the colorectal mass, metastatic and adrenal metastases, and widespread lymph node enlargement in the chest abdomen and pelvis.  He underwent a fine-needle aspiration of his supraclavicular lymph node which only showed necrotic tissue, no malignancy was identified.  He has been referred to our clinic for consideration of obtaining more tissue via bronchoscopy to assess for concomitant primary malignancy.  Apparently there is some concern of concomitant prostate cancer.  The patient had a port placed and was started on FOLFOX chemotherapy on February 18, 2022 with intention for 5 cycles of treatment.  So far he says he is tolerating treatment well without difficulty.  The patient is still smoking cigarettes.  He reports no shortness of breath, cough, mucus production.  He has never been diagnosed with bronchitis or pneumonia.  He still works actively and is not limited by shortness of breath.   Records from the [facility] oncology reviewed were in the summer 2023 he was diagnosed  with invasive adenocarcinoma on a colonoscopy where he was being assessed for pelvic mass.  He had widespread metastases which were FDG avid on PET scanning.  These included nodules in the lungs as well as mediastinum.  He had a supraclavicular lymph node biopsy performed which was necrotic tissue which was nondiagnostic.  He has been started on FOLFOX chemotherapy as of 7/31 with a plan for 5 cycles.Marland Kitchen  He was referred to [place] for consideration of biopsy of the mediastinal adenopathy and pulmonary nodules.  Past Medical History:  Diagnosis Date   Diabetes mellitus without complication (HCC)    High cholesterol    Hypertension    Tobacco use      Family History  Problem Relation Age of Onset   Dementia Mother    Diabetes Father    Lung cancer Maternal Grandfather        smoked     Social History   Socioeconomic History   Marital status: Single    Spouse name: Not on file   Number of children: Not on file   Years of education: Not on file   Highest education level: Not on file  Occupational History   Occupation: Engineer, technical sales, Metallurgist  Tobacco Use   Smoking status: Every Day    Packs/day: 1.00    Years: 43.00    Total pack years: 43.00    Types: Cigarettes   Smokeless tobacco: Never  Vaping Use   Vaping Use: Never used  Substance and Sexual Activity   Alcohol use: Yes    Alcohol/week: 42.0  standard drinks of alcohol    Types: 42 Standard drinks or equivalent per week    Comment: 6 pack/day   Drug use: No   Sexual activity: Not on file  Other Topics Concern   Not on file  Social History Narrative   Pt rents a room from a friend.   Social Determinants of Health   Financial Resource Strain: Not on file  Food Insecurity: Not on file  Transportation Needs: Not on file  Physical Activity: Not on file  Stress: Not on file  Social Connections: Not on file  Intimate Partner Violence: Not on file     Allergies  Allergen Reactions   Drug Class [Morphine  And Related] Itching    Per patient has tolerated oxycodone and hydrocodone     Outpatient Medications Prior to Visit  Medication Sig Dispense Refill   benazepril-hydrochlorthiazide (LOTENSIN HCT) 10-12.5 MG tablet Take 1 tablet by mouth daily.     glipiZIDE (GLUCOTROL) 10 MG tablet Take 10 mg by mouth daily.     oxyCODONE-acetaminophen (PERCOCET/ROXICET) 5-325 MG tablet Take 1 tablet by mouth every 6 (six) hours as needed.     polyethylene glycol (MIRALAX / GLYCOLAX) 17 g packet Take 17 g by mouth daily as needed.     aspirin 81 MG chewable tablet Chew 1 tablet (81 mg total) by mouth daily. 30 tablet 11   atorvastatin (LIPITOR) 40 MG tablet Take 1 tablet (40 mg total) by mouth daily. (Patient not taking: Reported on 12/28/2019) 30 tablet 6   diclofenac (FLECTOR) 1.3 % PTCH Place 1 patch onto the skin 2 (two) times daily. (Patient not taking: Reported on 12/28/2019) 5 patch 1   hydrochlorothiazide (HYDRODIURIL) 25 MG tablet Take 25 mg by mouth daily.     lisinopril (PRINIVIL,ZESTRIL) 40 MG tablet Take 20 mg by mouth daily.     Multiple Vitamins-Minerals (MULTIVITAMIN WITH MINERALS) tablet Take 1 tablet by mouth daily.     pantoprazole (PROTONIX) 40 MG tablet Take 1 tablet (40 mg total) by mouth daily. (Patient not taking: Reported on 12/28/2019) 30 tablet 3   sildenafil (REVATIO) 20 MG tablet Take 50 mg by mouth once as needed.     traMADol (ULTRAM) 50 MG tablet Take 1 tablet (50 mg total) by mouth every 6 (six) hours as needed. 15 tablet 0   No facility-administered medications prior to visit.    Review of Systems  Constitutional:  Negative for chills, fever, malaise/fatigue and weight loss.  HENT:  Negative for congestion, nosebleeds, sinus pain and sore throat.   Eyes:  Negative for photophobia, pain and discharge.  Respiratory:  Negative for cough, hemoptysis, sputum production, shortness of breath and wheezing.   Cardiovascular:  Negative for chest pain, palpitations, orthopnea and leg  swelling.  Gastrointestinal:  Negative for abdominal pain, constipation, diarrhea, nausea and vomiting.  Genitourinary:  Negative for dysuria, frequency, hematuria and urgency.  Musculoskeletal:  Negative for back pain, joint pain, myalgias and neck pain.  Skin:  Negative for itching and rash.  Neurological:  Negative for tingling, tremors, sensory change, speech change, focal weakness, seizures, weakness and headaches.  Psychiatric/Behavioral:  Negative for memory loss, substance abuse and suicidal ideas. The patient is not nervous/anxious.       Objective:  Physical Exam   Vitals:   02/21/22 1535  BP: 124/68  Pulse: 72  Temp: 97.9 F (36.6 C)  TempSrc: Oral  SpO2: 96%  Weight: 203 lb (92.1 kg)  Height: '5\' 5"'$  (1.651 m)  RA  Gen: well appearing, no acute distress HENT: NCAT, OP clear, neck supple without masses Eyes: PERRL, EOMi Lymph: no cervical lymphadenopathy PULM: CTA B CV: RRR, no mgr, no JVD GI: BS+, soft, nontender, no hsm Derm: no rash or skin breakdown MSK: normal bulk and tone Neuro: A&Ox4, CN II-XII intact, strength 5/5 in all 4 extremities Psyche: normal mood and affect   CBC    Component Value Date/Time   WBC 7.7 12/28/2019 1732   RBC 5.26 12/28/2019 1732   HGB 16.5 12/28/2019 1732   HCT 48.8 12/28/2019 1732   PLT 170 12/28/2019 1732   MCV 92.8 12/28/2019 1732   MCH 31.4 12/28/2019 1732   MCHC 33.8 12/28/2019 1732   RDW 13.2 12/28/2019 1732   LYMPHSABS 1.2 09/27/2018 0839   MONOABS 0.5 09/27/2018 0839   EOSABS 0.0 09/27/2018 0839   BASOSABS 0.1 09/27/2018 0839     Chest imaging: February 05, 2022 CT PET showed hypermetabolic rectal wall thickening compatible with malignancy, 5 hypermetabolic liver metastases, hypermetabolic lymph nodes in the neck chest and abdomen and pelvis, small pulmonary nodules below PET resolution, hypermetabolic left adrenal metastases, mildly hypermetabolic soft tissue thickening involving the left seminal vesicle,  hypermetabolic right acetabular lesion  PFT:  Labs:  Path:  Echo:  Heart Catheterization:       Assessment & Plan:   Tobacco use disorder  Metastatic colon cancer to liver [facility])  Pulmonary nodules  Mediastinal adenopathy  Discussion: Unfortunate 61 year old male comes to my clinic today in consultation for consideration of a bronchoscopy and EBUS for mediastinal adenopathy in the setting of recently diagnosed metastatic colon cancer.  We are happy to perform an EBUS fine-needle aspiration of his mediastinal lymph nodes to assist in this assessment, however the patient just started chemotherapy 3 days prior to this visit.  I will call and clarify with his oncologist if he wants [place] to go ahead and perform that procedure now or wait until he completes his course of chemotherapy.  Metastatic adenocarcinoma of the colon with mediastinal lymphadenopathy: I will reach out to Dr. An at [facility] oncology to discuss his request for a bronchoscopy and mediastinal lymph node biopsy. In the meantime continue taking cancer treatments as you are doing For now we will plan on following up in 10 weeks after you complete your course of chemotherapy and have repeat scanning  Cigarette smoking: Try to quit smoking cigarettes, if helpful I am happy to prescribe Chantix or nicotine replacement therapy  Follow-up with me 10 to 12 weeks from now, sooner if needed    Current Outpatient Medications:    benazepril-hydrochlorthiazide (LOTENSIN HCT) 10-12.5 MG tablet, Take 1 tablet by mouth daily., Disp: , Rfl:    glipiZIDE (GLUCOTROL) 10 MG tablet, Take 10 mg by mouth daily., Disp: , Rfl:    oxyCODONE-acetaminophen (PERCOCET/ROXICET) 5-325 MG tablet, Take 1 tablet by mouth every 6 (six) hours as needed., Disp: , Rfl:    polyethylene glycol (MIRALAX / GLYCOLAX) 17 g packet, Take 17 g by mouth daily as needed., Disp: , Rfl:

## 2022-02-21 NOTE — Patient Instructions (Signed)
Metastatic adenocarcinoma of the colon with mediastinal lymphadenopathy: I will reach out to Dr. An at [facility] oncology to discuss his request for a bronchoscopy and mediastinal lymph node biopsy. In the meantime continue taking cancer treatments as you are doing For now we will plan on following up in 10 weeks after you complete your course of chemotherapy and have repeat scanning  Cigarette smoking: Try to quit smoking cigarettes, if helpful I am happy to prescribe Chantix or nicotine replacement therapy  Follow-up with me 10 to 12 weeks from now, sooner if needed

## 2022-04-27 NOTE — Progress Notes (Deleted)
   Synopsis: Referred in 02/2022 for pulmonary nodules and mediastinal adenopathy.  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  At the time of diagnosis he was noted to pulmonary nodules which were no larger than 4 mm and not FDG avid however he had large mediastinal adenopathy which was FDG avid on PET scanning.  Subjective:   PATIENT ID: Dominic Francis GENDER: male DOB: Jan 11, 1961, MRN: [id number]   HPI  No chief complaint on file.  *** EBUS?  Past Medical History:  Diagnosis Date   Diabetes mellitus without complication (HCC)    High cholesterol    Hypertension    Tobacco use       ROS    Objective:  Physical Exam   There were no vitals filed for this visit.   RA   ***   CBC    Component Value Date/Time   WBC 7.7 12/28/2019 1732   RBC 5.26 12/28/2019 1732   HGB 16.5 12/28/2019 1732   HCT 48.8 12/28/2019 1732   PLT 170 12/28/2019 1732   MCV 92.8 12/28/2019 1732   MCH 31.4 12/28/2019 1732   MCHC 33.8 12/28/2019 1732   RDW 13.2 12/28/2019 1732   LYMPHSABS 1.2 09/27/2018 0839   MONOABS 0.5 09/27/2018 0839   EOSABS 0.0 09/27/2018 0839   BASOSABS 0.1 09/27/2018 0839     Chest imaging: February 05, 2022 CT PET showed hypermetabolic rectal wall thickening compatible with malignancy, 5 hypermetabolic liver metastases, hypermetabolic lymph nodes in the neck chest and abdomen and pelvis, small pulmonary nodules below PET resolution, hypermetabolic left adrenal metastases, mildly hypermetabolic soft tissue thickening involving the left seminal vesicle, hypermetabolic right acetabular lesion  PFT:  Labs:  Path:  Echo:  Heart Catheterization:       Assessment & Plan:   No diagnosis found.  Discussion: ***      Current Outpatient Medications:    benazepril-hydrochlorthiazide (LOTENSIN HCT) 10-12.5 MG tablet, Take 1 tablet by mouth daily., Disp: , Rfl:    glipiZIDE (GLUCOTROL) 10 MG tablet, Take  10 mg by mouth daily., Disp: , Rfl:    polyethylene glycol (MIRALAX / GLYCOLAX) 17 g packet, Take 17 g by mouth daily as needed., Disp: , Rfl:

## 2022-05-03 ENCOUNTER — Ambulatory Visit: Payer: No Typology Code available for payment source | Admitting: Pulmonary Disease

## 2023-06-02 ENCOUNTER — Emergency Department (HOSPITAL_COMMUNITY)
Admission: EM | Admit: 2023-06-02 | Discharge: 2023-06-02 | Disposition: A | Discharge status: Home / Self Care | Payer: No Typology Code available for payment source | Attending: Emergency Medicine | Admitting: Emergency Medicine

## 2023-06-02 ENCOUNTER — Other Ambulatory Visit: Payer: Self-pay

## 2023-06-02 ENCOUNTER — Encounter (HOSPITAL_COMMUNITY): Payer: Self-pay | Admitting: Emergency Medicine

## 2023-06-02 DIAGNOSIS — R319 Hematuria, unspecified: Secondary | ICD-10-CM | POA: Insufficient documentation

## 2023-06-02 DIAGNOSIS — T839XXA Unspecified complication of genitourinary prosthetic device, implant and graft, initial encounter: Secondary | ICD-10-CM

## 2023-06-02 DIAGNOSIS — T83091A Other mechanical complication of indwelling urethral catheter, initial encounter: Secondary | ICD-10-CM | POA: Insufficient documentation

## 2023-06-02 LAB — URINALYSIS, ROUTINE W REFLEX MICROSCOPIC
Bilirubin Urine: NEGATIVE
Glucose, UA: NEGATIVE mg/dL
Ketones, ur: NEGATIVE mg/dL
Nitrite: NEGATIVE
Protein, ur: 100 mg/dL — AB
RBC / HPF: >50 RBC/hpf (ref 0–5)
Specific Gravity, Urine: 1.003 — ABNORMAL LOW (ref 1.005–1.030)
pH: 6 (ref 5.0–8.0)

## 2023-06-02 LAB — BASIC METABOLIC PANEL
Anion gap: 9 (ref 5–15)
BUN: 11 mg/dL (ref 8–23)
CO2: 24 mmol/L (ref 22–32)
Calcium: 8.8 mg/dL — ABNORMAL LOW (ref 8.9–10.3)
Chloride: 100 mmol/L (ref 98–111)
Creatinine, Ser: 0.88 mg/dL (ref 0.61–1.24)
GFR, Estimated: >60 mL/min (ref >60)
Glucose, Bld: 182 mg/dL — ABNORMAL HIGH (ref 70–99)
Potassium: 4.2 mmol/L (ref 3.5–5.1)
Sodium: 133 mmol/L — ABNORMAL LOW (ref 135–145)

## 2023-06-02 LAB — CBC WITH DIFFERENTIAL/PLATELET
Abs Immature Granulocytes: 0.04 10*3/uL (ref 0.00–0.07)
Basophils Absolute: 0.1 10*3/uL (ref 0.0–0.1)
Basophils Relative: 2 %
Eosinophils Absolute: 0.3 10*3/uL (ref 0.0–0.5)
Eosinophils Relative: 7 %
HCT: 38.5 % — ABNORMAL LOW (ref 39.0–52.0)
Hemoglobin: 12.8 g/dL — ABNORMAL LOW (ref 13.0–17.0)
Immature Granulocytes: 1 %
Lymphocytes Relative: 21 %
Lymphs Abs: 0.9 10*3/uL (ref 0.7–4.0)
MCH: 30.3 pg (ref 26.0–34.0)
MCHC: 33.2 g/dL (ref 30.0–36.0)
MCV: 91.2 fL (ref 80.0–100.0)
Monocytes Absolute: 0.7 10*3/uL (ref 0.1–1.0)
Monocytes Relative: 16 %
Neutro Abs: 2.3 10*3/uL (ref 1.7–7.7)
Neutrophils Relative %: 53 %
Platelets: 232 10*3/uL (ref 150–400)
RBC: 4.22 MIL/uL (ref 4.22–5.81)
RDW: 13.8 % (ref 11.5–15.5)
WBC: 4.3 10*3/uL (ref 4.0–10.5)
nRBC: 0 % (ref 0.0–0.2)

## 2023-06-02 MED ORDER — CEPHALEXIN 500 MG PO CAPS
500.0000 mg | ORAL_CAPSULE | Freq: Two times a day (BID) | ORAL | 0 refills | Status: AC
Start: 2023-06-02 — End: ?

## 2023-06-02 MED ORDER — CEPHALEXIN 500 MG PO CAPS
500.0000 mg | ORAL_CAPSULE | Freq: Once | ORAL | Status: AC
  Administered 2023-06-02: 500 mg via ORAL
  Filled 2023-06-02: qty 1

## 2023-06-02 NOTE — ED Triage Notes (Addendum)
Pt reports his catheter stopped draining about an hour ago. Pt reports he has some leaking around the site. States pain is 10/10. Hx colon cancer

## 2023-06-02 NOTE — Discharge Instructions (Signed)
Please call the urologist today and let them know what happened tonight.  Return for new or worsening symptoms.

## 2023-06-02 NOTE — ED Provider Notes (Signed)
[facility]-EMERGENCY DEPT [facility] Emergency Department Provider Note MRN:  [id number]  Arrival date & time: 06/02/23     Chief Complaint   Urinary Retention  History of Present Illness   Dominic Francis is a 62 y.o. year-old male presents to the ED with chief complaint of urinary retention.  He states that he has indwelling 18 fr coude foley catheter for urinary retention 2/2 BPH or BOO per urology.  Patient states that about 2 hours ago, he noticed that the catheter was no longer draining.  He states that he has seen some blood in the urine.  He reports associated suprapubic abdominal pain.  Denies any other associated symptoms.  History provided by patient.   Review of Systems  Pertinent positive and negative review of systems noted in HPI.    Physical Exam   Vitals:   06/02/23 0239 06/02/23 0334  BP:  (!) 146/88  Pulse:  83  Resp:  18  Temp: 97.7 F (36.5 C)   SpO2:  99%    CONSTITUTIONAL:  uncomfortable-appearing, NAD NEURO:  Alert and oriented x 3, CN 3-12 grossly intact EYES:  eyes equal and reactive ENT/NECK:  Supple, no stridor  CARDIO:  mildly tachycardic, regular rhythm, appears well-perfused  PULM:  No respiratory distress,  GI/GU:  non-distended, suprapubic abdominal tenderness, indwelling foley catheter MSK/SPINE:  No gross deformities, no edema, moves all extremities  SKIN:  no rash, atraumatic   *Additional and/or pertinent findings included in MDM below  Diagnostic and Interventional Summary    EKG Interpretation Date/Time:    Ventricular Rate:    PR Interval:    QRS Duration:    QT Interval:    QTC Calculation:   R Axis:      Text Interpretation:         Labs Reviewed  CBC WITH DIFFERENTIAL/PLATELET - Abnormal; Notable for the following components:      Result Value   Hemoglobin 12.8 (*)    HCT 38.5 (*)    All other components within normal limits  BASIC METABOLIC PANEL - Abnormal; Notable for the following components:   Sodium 133 (*)     Glucose, Bld 182 (*)    Calcium 8.8 (*)    All other components within normal limits  URINALYSIS, ROUTINE W REFLEX MICROSCOPIC - Abnormal; Notable for the following components:   Color, Urine RED (*)    APPearance CLOUDY (*)    Specific Gravity, Urine 1.003 (*)    Hgb urine dipstick MODERATE (*)    Protein, ur 100 (*)    Leukocytes,Ua SMALL (*)    Bacteria, UA MANY (*)    All other components within normal limits    No orders to display    Medications  cephALEXin (KEFLEX) capsule 500 mg (500 mg Oral Given 06/02/23 0409)     Procedures  /  Critical Care Procedures  ED Course and Medical Decision Making  I have reviewed the triage vital signs, the nursing notes, and pertinent available records from the EMR.  Social Determinants Affecting Complexity of Care: Patient has no clinically significant social determinants affecting this chief complaint..   ED Course:    Medical Decision Making Patient here with suprapubic pain and indwelling foley catheter.  States that his catheter hasn't been draining for the past couple of hours.  Concern for obstructed catheter.  Will have RN irrigate catheter and check bladder scan.  Might need to exchange the catheter if irrigation isn't successful.  Will check kidney function and  UA.  Normal creatinine.  No significant leukocytosis.  Will cover with keflex.  Urology follow-up.  Amount and/or Complexity of Data Reviewed Labs: ordered.  Risk Prescription drug management.         Consultants: No consultations were needed in caring for this patient.   Treatment and Plan: Emergency department workup does not suggest an emergent condition requiring admission or immediate intervention beyond  what has been performed at this time. The patient is safe for discharge and has  been instructed to return immediately for worsening symptoms, change in  symptoms or any other concerns    Final Clinical Impressions(s) / ED Diagnoses      ICD-10-CM   1. Foley catheter problem, initial encounter (HCC)  [email or web address]       ED Discharge Orders          Ordered    cephALEXin (KEFLEX) 500 MG capsule  2 times daily        06/02/23 0400              Discharge Instructions Discussed with and Provided to Patient:     Discharge Instructions      Please call the urologist today and let them know what happened tonight.  Return for new or worsening symptoms.       Roxy Horseman, PA-C 06/02/23 0411    Nira Conn, MD 06/02/23 [phone]

## 2023-06-02 NOTE — ED Notes (Signed)
bladder scan showed . I irrigated throught catheter and it drained about 3-400 ml in bag. There was decent amount of small clots and red urine

## 2023-06-10 ENCOUNTER — Emergency Department (HOSPITAL_BASED_OUTPATIENT_CLINIC_OR_DEPARTMENT_OTHER)
Admission: EM | Admit: 2023-06-10 | Discharge: 2023-06-11 | Disposition: A | Discharge status: Home / Self Care | Payer: Non-veteran care | Attending: Emergency Medicine | Admitting: Emergency Medicine

## 2023-06-10 ENCOUNTER — Encounter (HOSPITAL_BASED_OUTPATIENT_CLINIC_OR_DEPARTMENT_OTHER): Payer: Self-pay

## 2023-06-10 ENCOUNTER — Other Ambulatory Visit: Payer: Self-pay

## 2023-06-10 DIAGNOSIS — I10 Essential (primary) hypertension: Secondary | ICD-10-CM | POA: Diagnosis present

## 2023-06-10 DIAGNOSIS — F17200 Nicotine dependence, unspecified, uncomplicated: Secondary | ICD-10-CM | POA: Diagnosis not present

## 2023-06-10 DIAGNOSIS — Z85040 Personal history of malignant carcinoid tumor of rectum: Secondary | ICD-10-CM | POA: Diagnosis not present

## 2023-06-10 DIAGNOSIS — E119 Type 2 diabetes mellitus without complications: Secondary | ICD-10-CM | POA: Diagnosis not present

## 2023-06-10 NOTE — ED Provider Notes (Signed)
[name] Provider Note   CSN: [id number] Arrival date & time: 06/10/23  2201     History {Add pertinent medical, surgical, social history, OB history to HPI:1} Chief Complaint  Patient presents with   Hypertension    Dominic Francis is a 62 y.o. male.  HPI      This is a 61 year old male with history of metastatic rectal cancer who presents with concerns for high blood pressure.  Patient was receiving a home infusion of chemotherapy today.  Patient reports that he had sudden onset of what felt like a hot flash as well as "not feeling right."  He has had this chemotherapy before and has never had an adverse event.  Denies any shortness of breath, chest pain, abdominal pain, nausea, vomiting.  States that he currently is at his baseline.  He was concerned because his blood pressure was high at home.  He has been inconsistent at taking his blood pressure medications daily.  He did take his daily hydrochlorothiazide-benazepril after noting his blood pressure was high. Home Medications Prior to Admission medications   Medication Sig Start Date End Date Taking? Authorizing Provider  benazepril-hydrochlorthiazide (LOTENSIN HCT) 10-12.5 MG tablet Take 1 tablet by mouth daily.    [provider]  cephALEXin (KEFLEX) 500 MG capsule Take 1 capsule (500 mg total) by mouth 2 (two) times daily. 06/02/23   Roxy Horseman, PA-C  glipiZIDE (GLUCOTROL) 10 MG tablet Take 10 mg by mouth daily. 12/03/21   [provider]  polyethylene glycol (MIRALAX / GLYCOLAX) 17 g packet Take 17 g by mouth daily as needed.    [provider]      Allergies    Drug class [morphine and codeine]    Review of Systems   Review of Systems  Constitutional:  Negative for fever.  Respiratory:  Negative for shortness of breath.   Cardiovascular:  Negative for chest pain.  Gastrointestinal:  Negative for abdominal pain, nausea and vomiting.  All other  systems reviewed and are negative.   Physical Exam Updated Vital Signs BP (!) 177/93   Pulse 93   Temp (!) 97.4 F (36.3 C)   Resp 18   Ht 1.651 m (5\' 5" )   Wt 92.1 kg   SpO2 99%   BMI 33.78 kg/m  Physical Exam Vitals and nursing note reviewed.  Constitutional:      Appearance: He is well-developed. He is ill-appearing. He is not toxic-appearing.  HENT:     Head: Normocephalic and atraumatic.     Mouth/Throat:     Mouth: Mucous membranes are moist.  Eyes:     Pupils: Pupils are equal, round, and reactive to light.  Cardiovascular:     Rate and Rhythm: Normal rate and regular rhythm.     Heart sounds: Normal heart sounds. No murmur heard.    Comments: Port right upper chest Pulmonary:     Effort: Pulmonary effort is normal. No respiratory distress.     Breath sounds: Normal breath sounds. No wheezing.  Abdominal:     Palpations: Abdomen is soft.     Tenderness: There is no abdominal tenderness. There is no rebound.  Musculoskeletal:     Cervical back: Neck supple.  Lymphadenopathy:     Cervical: No cervical adenopathy.  Skin:    General: Skin is warm and dry.  Neurological:     Mental Status: He is alert and oriented to person, place, and time.  Psychiatric:  Mood and Affect: Mood normal.     ED Results / Procedures / Treatments   Labs (all labs ordered are listed, but only abnormal results are displayed) Labs Reviewed  CBC WITH DIFFERENTIAL/PLATELET  BASIC METABOLIC PANEL    EKG None  Radiology No results found.  Procedures Procedures  {Document cardiac monitor, telemetry assessment procedure when appropriate:1}  Medications Ordered in ED Medications - No data to display  ED Course/ Medical Decision Making/ A&P   {   Click here for ABCD2, HEART and other calculatorsREFRESH Note before signing :1}                              Medical Decision Making Amount and/or Complexity of Data Reviewed Labs: ordered.   ***  {Document critical  care time when appropriate:1} {Document review of labs and clinical decision tools ie heart score, Chads2Vasc2 etc:1}  {Document your independent review of radiology images, and any outside records:1} {Document your discussion with family members, caretakers, and with consultants:1} {Document social determinants of health affecting pt's care:1} {Document your decision making why or why not admission, treatments were needed:1} Final Clinical Impression(s) / ED Diagnoses Final diagnoses:  None    Rx / DC Orders ED Discharge Orders     None

## 2023-06-10 NOTE — ED Triage Notes (Signed)
Pt complaining of his blood pressure being high today that was 180/99. Has not been feeling well after his chemo treatment this am.

## 2023-06-11 NOTE — ED Notes (Signed)
Pt. Refused lab work and EKG, Dr.Horton notified of same

## 2023-06-11 NOTE — Discharge Instructions (Signed)
You were seen today with concerns for high blood pressure.  Make sure that you are taking your blood pressure medications daily and follow-up closely with your doctor.

## 2023-11-23 ENCOUNTER — Emergency Department (HOSPITAL_COMMUNITY)

## 2023-11-23 ENCOUNTER — Emergency Department (HOSPITAL_COMMUNITY)
Admission: EM | Admit: 2023-11-23 | Discharge: 2023-11-23 | Disposition: A | Discharge status: Home / Self Care | Attending: Emergency Medicine | Admitting: Emergency Medicine

## 2023-11-23 ENCOUNTER — Other Ambulatory Visit: Payer: Self-pay

## 2023-11-23 ENCOUNTER — Encounter (HOSPITAL_COMMUNITY): Payer: Self-pay

## 2023-11-23 DIAGNOSIS — K5900 Constipation, unspecified: Secondary | ICD-10-CM | POA: Insufficient documentation

## 2023-11-23 DIAGNOSIS — Z7984 Long term (current) use of oral hypoglycemic drugs: Secondary | ICD-10-CM | POA: Insufficient documentation

## 2023-11-23 DIAGNOSIS — Z85038 Personal history of other malignant neoplasm of large intestine: Secondary | ICD-10-CM | POA: Insufficient documentation

## 2023-11-23 DIAGNOSIS — E119 Type 2 diabetes mellitus without complications: Secondary | ICD-10-CM | POA: Diagnosis not present

## 2023-11-23 DIAGNOSIS — R1084 Generalized abdominal pain: Secondary | ICD-10-CM

## 2023-11-23 LAB — CBC WITH DIFFERENTIAL/PLATELET
Abs Immature Granulocytes: 0.05 10*3/uL (ref 0.00–0.07)
Basophils Absolute: 0.1 10*3/uL (ref 0.0–0.1)
Basophils Relative: 1 %
Eosinophils Absolute: 0.2 10*3/uL (ref 0.0–0.5)
Eosinophils Relative: 3 %
HCT: 44.1 % (ref 39.0–52.0)
Hemoglobin: 13.6 g/dL (ref 13.0–17.0)
Immature Granulocytes: 1 %
Lymphocytes Relative: 7 %
Lymphs Abs: 0.6 10*3/uL — ABNORMAL LOW (ref 0.7–4.0)
MCH: 28.8 pg (ref 26.0–34.0)
MCHC: 30.8 g/dL (ref 30.0–36.0)
MCV: 93.4 fL (ref 80.0–100.0)
Monocytes Absolute: 0.7 10*3/uL (ref 0.1–1.0)
Monocytes Relative: 9 %
Neutro Abs: 6.3 10*3/uL (ref 1.7–7.7)
Neutrophils Relative %: 79 %
Platelets: 228 10*3/uL (ref 150–400)
RBC: 4.72 MIL/uL (ref 4.22–5.81)
RDW: 14.6 % (ref 11.5–15.5)
WBC: 8 10*3/uL (ref 4.0–10.5)
nRBC: 0 % (ref 0.0–0.2)

## 2023-11-23 LAB — COMPREHENSIVE METABOLIC PANEL WITH GFR
ALT: 39 U/L (ref 0–44)
AST: 65 U/L — ABNORMAL HIGH (ref 15–41)
Albumin: 2.8 g/dL — ABNORMAL LOW (ref 3.5–5.0)
Alkaline Phosphatase: 384 U/L — ABNORMAL HIGH (ref 38–126)
Anion gap: 10 (ref 5–15)
BUN: 12 mg/dL (ref 8–23)
CO2: 25 mmol/L (ref 22–32)
Calcium: 8.8 mg/dL — ABNORMAL LOW (ref 8.9–10.3)
Chloride: 104 mmol/L (ref 98–111)
Creatinine, Ser: 0.75 mg/dL (ref 0.61–1.24)
GFR, Estimated: >60 mL/min (ref >60)
Glucose, Bld: 146 mg/dL — ABNORMAL HIGH (ref 70–99)
Potassium: 3.9 mmol/L (ref 3.5–5.1)
Sodium: 139 mmol/L (ref 135–145)
Total Bilirubin: 0.7 mg/dL (ref 0.0–1.2)
Total Protein: 6.5 g/dL (ref 6.5–8.1)

## 2023-11-23 MED ORDER — IOHEXOL 300 MG/ML  SOLN
100.0000 mL | Freq: Once | INTRAMUSCULAR | Status: AC | PRN
  Administered 2023-11-23: 100 mL via INTRAVENOUS

## 2023-11-23 MED ORDER — POLYETHYLENE GLYCOL 3350 17 G PO PACK: 17.0000 g | PACK | Freq: Every day | ORAL | 0 refills | Status: AC | PRN

## 2023-11-23 MED ORDER — HYDROMORPHONE HCL 1 MG/ML IJ SOLN
1.0000 mg | Freq: Once | INTRAMUSCULAR | Status: AC
  Administered 2023-11-23: 1 mg via INTRAVENOUS
  Filled 2023-11-23: qty 1

## 2023-11-23 NOTE — ED Triage Notes (Signed)
 Pt arrived reporting he has abdominal/colon pain. States he is constipation, takes pain meds and has not been taking stool softener. Reports he has colon ca. Not currently on any treatments. No blood in stool.

## 2023-11-23 NOTE — ED Provider Notes (Addendum)
  Physical Exam  BP 110/62   Pulse 88   Temp 98.1 F (36.7 C) (Oral)   Resp 18   Ht 5\' 5"  (1.651 m)   Wt 92 kg   SpO2 98%   BMI 33.75 kg/m   Physical Exam  Procedures  Procedures  ED Course / MDM    Medical Decision Making Amount and/or Complexity of Data Reviewed Labs: ordered. Radiology: ordered.  Risk OTC drugs. Prescription drug management.   Cancer patient - has port Colorectal cancer Chemo at [facility] Used an enema today for constipation- bloody discharge after, no stool CT abd/pel pending No fever, vomiting  17:00 - introduction and recheck. CT read pending.  The patient somewhat hostile, verbally aggressive, is frustrated with having "someone new" every time a person enters the room. Needs addressed. Updated on delay with CT read. Review of [facility] chart 10/17/23 with similar hostility, patient left before completion of care.   He no longer has a foley catheter and reports he is incontinent, wears a brief. Declined catheterized urine  CT abd/pel:  IMPRESSION: 1. Multiple hepatic metastatic lesions. 2. Left adrenal gland metastatic lesions. 3. Retroperitoneal adenopathy. 4. L4 sclerotic changes consistent with metastatic disease. 5. Bibasilar pulmonary nodules concerning for metastatic disease. Partially visualized soft tissue fullness of the right hilum and peribronchial thickening concerning for an infiltrative mass. Dedicated chest CT is recommended for better evaluation of the lungs. 6. Mild bilateral hydronephrosis, left greater than right. No obstructing stone noted. 7. Diffuse thickening of the bladder wall with mild trabeculation which may be secondary to chronic bladder outlet obstruction. Superimposed cystitis or an infiltrative process is not excluded. 8. No bowel obstruction. 9.  Aortic Atherosclerosis (ICD10-I70.0).  CT angio 3/28 reviewed similar findings. No obstruction today Patient wound not allow urine collection Discussed  medications for constipation.  He is felt appropriate for discharge home.  Findings and patient presentation reviewed with Dr. Delana Favors.        Mandy Second, PA-C 11/23/23 1806  ADDENDUM: I was contacted by nursing after discharge that the patient feels his pain was not addressed during this visit. On review, he received 1 mg Dilaudid in the ED. On review of PDMD, he receives oxycodone and oxycontin from [facility] providers, and by count should have both at home. It was felt additional opioids would only worsen his issue of constipation. I went to discuss this with the patient but he left prior to my getting to his room.     Mandy Second, PA-C 11/23/23 1843    Dalene Duck, MD 11/23/23 [phone]

## 2023-11-23 NOTE — Discharge Instructions (Signed)
 You can follow up with [facility] as scheduled.   Recommend Miralax as previously prescribed but that can be found over-the-counter. Also recommend a stool softener such as Colace, which is also found in any drug store.

## 2023-11-23 NOTE — ED Provider Notes (Signed)
 [place] EMERGENCY DEPARTMENT AT [facility] Provider Note   CSN: [id number] Arrival date & time: 11/23/23  1237     History  Chief Complaint  Patient presents with   Constipation   Abdominal Pain    Lonzo Vosburgh is a 63 y.o. male.  Patient complains of constipation.  Patient has colon cancer and is currently in treatment.  Patient reports the chemotherapy causes him to fluctuate between constipation and diarrhea.  Patient reports he tried an enema at home but only got blood back.  Patient complains of severe pain.  Patient is on oxycodone for pain.  Patient reports today he is unable to have a bowel movement.  He reports he has been taking a laxative.  Patient denies any fever or chills.  He has not had any nausea or vomiting.  Patient reports he has abdominal and rectal pain.  Patient is on oxycodone for pain management.  Patient has a past medical history of hypertension and diabetes.  Patient reports he is treated by oncology at [facility].  The history is provided by the patient. No language interpreter was used.  Constipation Associated symptoms: abdominal pain   Abdominal Pain Associated symptoms: constipation        Home Medications Prior to Admission medications   Medication Sig Start Date End Date Taking? Authorizing Provider  benazepril-hydrochlorthiazide (LOTENSIN HCT) 10-12.5 MG tablet Take 1 tablet by mouth daily.    [provider]  cephALEXin  (KEFLEX ) 500 MG capsule Take 1 capsule (500 mg total) by mouth 2 (two) times daily. 06/02/23   Sherel Dikes, PA-C  glipiZIDE (GLUCOTROL) 10 MG tablet Take 10 mg by mouth daily. 12/03/21   [provider]  polyethylene glycol (MIRALAX / GLYCOLAX) 17 g packet Take 17 g by mouth daily as needed.    [provider]      Allergies    Atorvastatin , Metformin, Pantoprazole , Simvastatin, and Drug class [morphine  and codeine]    Review of Systems   Review of Systems   Gastrointestinal:  Positive for abdominal pain and constipation.  All other systems reviewed and are negative.   Physical Exam Updated Vital Signs BP 110/62   Pulse 88   Temp 98.1 F (36.7 C) (Oral)   Resp 18   Ht 5\' 5"  (1.651 m)   Wt 92 kg   SpO2 98%   BMI 33.75 kg/m  Physical Exam Vitals and nursing note reviewed.  Constitutional:      Appearance: He is well-developed.  HENT:     Head: Normocephalic.  Eyes:     Extraocular Movements: Extraocular movements intact.  Cardiovascular:     Rate and Rhythm: Normal rate and regular rhythm.  Pulmonary:     Effort: Pulmonary effort is normal.  Abdominal:     General: Abdomen is flat. Bowel sounds are normal. There is no distension.     Palpations: Abdomen is soft.     Tenderness: There is abdominal tenderness.  Musculoskeletal:        General: Normal range of motion.     Cervical back: Normal range of motion.  Skin:    General: Skin is warm.  Neurological:     Mental Status: He is alert and oriented to person, place, and time.     ED Results / Procedures / Treatments   Labs (all labs ordered are listed, but only abnormal results are displayed) Labs Reviewed  CBC WITH DIFFERENTIAL/PLATELET - Abnormal; Notable for the following components:  Result Value   Lymphs Abs 0.6 (*)    All other components within normal limits  COMPREHENSIVE METABOLIC PANEL WITH GFR - Abnormal; Notable for the following components:   Glucose, Bld 146 (*)    Calcium  8.8 (*)    Albumin 2.8 (*)    AST 65 (*)    Alkaline Phosphatase 384 (*)    All other components within normal limits  URINALYSIS, ROUTINE W REFLEX MICROSCOPIC    EKG None  Radiology No results found.  Procedures Procedures    Medications Ordered in ED Medications  HYDROmorphone (DILAUDID) injection 1 mg (has no administration in time range)    ED Course/ Medical Decision Making/ A&P                                 Medical Decision Making Patient complains  of abdominal pain and rectal pain.  Patient has colorectal cancer and is on chemotherapy.  Amount and/or Complexity of Data Reviewed Labs: ordered.    Details: Labs ordered reviewed and interpreted.  Patient has a normal white blood cell count.  Glucose is 146.  Alkaline phos is 384. Radiology: ordered and independent interpretation performed. Decision-making details documented in ED Course.    Details: CT abdomen and pelvis ordered  Risk Prescription drug management. Risk Details: Patient given IV Dilaudid and Zofran.  Patient's care is turned over to Mandy Second [facility] with CT scan pending.           Final Clinical Impression(s) / ED Diagnoses Final diagnoses:  Generalized abdominal pain  Constipation, unspecified constipation type    Rx / DC Orders ED Discharge Orders     None         Evelyn Hire 11/23/23 1539    Lind Repine, MD 11/26/23 2006

## 2024-01-20 DEATH — deceased
